# Patient Record
Sex: Female | Born: 1935 | Race: White | Hispanic: No | Marital: Married | Smoking: Never smoker
Health system: Southern US, Community
[De-identification: ages and names within clinical notes are randomized; demographics above are authoritative.]

## PROBLEM LIST (undated history)

## (undated) DIAGNOSIS — I4891 Unspecified atrial fibrillation: Secondary | ICD-10-CM

## (undated) DIAGNOSIS — R238 Other skin changes: Secondary | ICD-10-CM

## (undated) DIAGNOSIS — I499 Cardiac arrhythmia, unspecified: Secondary | ICD-10-CM

## (undated) DIAGNOSIS — E079 Disorder of thyroid, unspecified: Secondary | ICD-10-CM

## (undated) DIAGNOSIS — C50919 Malignant neoplasm of unspecified site of unspecified female breast: Secondary | ICD-10-CM

## (undated) DIAGNOSIS — C801 Malignant (primary) neoplasm, unspecified: Secondary | ICD-10-CM

## (undated) DIAGNOSIS — I639 Cerebral infarction, unspecified: Secondary | ICD-10-CM

## (undated) DIAGNOSIS — E7800 Pure hypercholesterolemia, unspecified: Secondary | ICD-10-CM

## (undated) DIAGNOSIS — M8580 Other specified disorders of bone density and structure, unspecified site: Secondary | ICD-10-CM

## (undated) DIAGNOSIS — N63 Unspecified lump in unspecified breast: Secondary | ICD-10-CM

## (undated) DIAGNOSIS — I519 Heart disease, unspecified: Secondary | ICD-10-CM

## (undated) DIAGNOSIS — Z973 Presence of spectacles and contact lenses: Secondary | ICD-10-CM

## (undated) DIAGNOSIS — I10 Essential (primary) hypertension: Secondary | ICD-10-CM

## (undated) DIAGNOSIS — R32 Unspecified urinary incontinence: Secondary | ICD-10-CM

## (undated) DIAGNOSIS — Z923 Personal history of irradiation: Secondary | ICD-10-CM

## (undated) DIAGNOSIS — E785 Hyperlipidemia, unspecified: Secondary | ICD-10-CM

## (undated) DIAGNOSIS — E039 Hypothyroidism, unspecified: Secondary | ICD-10-CM

## (undated) DIAGNOSIS — R233 Spontaneous ecchymoses: Secondary | ICD-10-CM

## (undated) HISTORY — DX: Unspecified atrial fibrillation: I48.91

## (undated) HISTORY — DX: Other specified disorders of bone density and structure, unspecified site: M85.80

## (undated) HISTORY — DX: Presence of spectacles and contact lenses: Z97.3

## (undated) HISTORY — PX: ABDOMINAL HYSTERECTOMY: SHX81

## (undated) HISTORY — DX: Cerebral infarction, unspecified: I63.9

## (undated) HISTORY — DX: Essential (primary) hypertension: I10

## (undated) HISTORY — DX: Unspecified urinary incontinence: R32

## (undated) HISTORY — DX: Disorder of thyroid, unspecified: E07.9

## (undated) HISTORY — DX: Hyperlipidemia, unspecified: E78.5

## (undated) HISTORY — DX: Hypothyroidism, unspecified: E03.9

## (undated) HISTORY — DX: Malignant (primary) neoplasm, unspecified: C80.1

## (undated) HISTORY — PX: OVARIAN CYST SURGERY: SHX726

## (undated) HISTORY — DX: Other skin changes: R23.8

## (undated) HISTORY — DX: Unspecified lump in unspecified breast: N63.0

## (undated) HISTORY — DX: Malignant neoplasm of unspecified site of unspecified female breast: C50.919

## (undated) HISTORY — DX: Cardiac arrhythmia, unspecified: I49.9

## (undated) HISTORY — DX: Heart disease, unspecified: I51.9

## (undated) HISTORY — PX: TONSILLECTOMY AND ADENOIDECTOMY: SUR1326

## (undated) HISTORY — DX: Spontaneous ecchymoses: R23.3

## (undated) HISTORY — PX: APPENDECTOMY: SHX54

## (undated) HISTORY — DX: Pure hypercholesterolemia, unspecified: E78.00

## (undated) HISTORY — PX: CATARACT EXTRACTION, BILATERAL: SHX1313

---

## 2000-09-07 ENCOUNTER — Encounter: Payer: Self-pay | Admitting: Orthopedic Surgery

## 2000-09-10 ENCOUNTER — Inpatient Hospital Stay (HOSPITAL_COMMUNITY): Admission: RE | Admit: 2000-09-10 | Discharge: 2000-09-15 | Payer: Self-pay | Admitting: Orthopedic Surgery

## 2001-11-02 ENCOUNTER — Encounter: Payer: Self-pay | Admitting: Orthopedic Surgery

## 2001-11-02 ENCOUNTER — Encounter: Admission: RE | Admit: 2001-11-02 | Discharge: 2001-11-02 | Payer: Self-pay | Admitting: Orthopedic Surgery

## 2002-02-08 ENCOUNTER — Encounter: Payer: Self-pay | Admitting: Orthopedic Surgery

## 2002-02-09 ENCOUNTER — Inpatient Hospital Stay (HOSPITAL_COMMUNITY): Admission: RE | Admit: 2002-02-09 | Discharge: 2002-02-15 | Payer: Self-pay | Admitting: Orthopedic Surgery

## 2002-02-09 ENCOUNTER — Encounter: Payer: Self-pay | Admitting: Orthopedic Surgery

## 2002-03-21 ENCOUNTER — Inpatient Hospital Stay (HOSPITAL_COMMUNITY): Admission: EM | Admit: 2002-03-21 | Discharge: 2002-03-22 | Payer: Self-pay | Admitting: Emergency Medicine

## 2002-03-21 ENCOUNTER — Encounter: Payer: Self-pay | Admitting: Emergency Medicine

## 2002-03-22 ENCOUNTER — Encounter (INDEPENDENT_AMBULATORY_CARE_PROVIDER_SITE_OTHER): Payer: Self-pay | Admitting: Cardiology

## 2003-03-18 ENCOUNTER — Encounter: Payer: Self-pay | Admitting: Emergency Medicine

## 2003-03-18 ENCOUNTER — Inpatient Hospital Stay (HOSPITAL_COMMUNITY): Admission: EM | Admit: 2003-03-18 | Discharge: 2003-03-28 | Payer: Self-pay | Admitting: Emergency Medicine

## 2003-03-18 DIAGNOSIS — I639 Cerebral infarction, unspecified: Secondary | ICD-10-CM

## 2003-03-18 HISTORY — DX: Cerebral infarction, unspecified: I63.9

## 2003-03-19 ENCOUNTER — Encounter: Payer: Self-pay | Admitting: Pediatrics

## 2003-03-20 ENCOUNTER — Encounter (INDEPENDENT_AMBULATORY_CARE_PROVIDER_SITE_OTHER): Payer: Self-pay | Admitting: *Deleted

## 2003-03-21 ENCOUNTER — Encounter: Payer: Self-pay | Admitting: Pulmonary Disease

## 2003-03-21 ENCOUNTER — Encounter (INDEPENDENT_AMBULATORY_CARE_PROVIDER_SITE_OTHER): Payer: Self-pay | Admitting: Cardiology

## 2003-05-14 ENCOUNTER — Emergency Department (HOSPITAL_COMMUNITY): Admission: EM | Admit: 2003-05-14 | Discharge: 2003-05-14 | Payer: Self-pay | Admitting: Podiatry

## 2003-05-18 ENCOUNTER — Emergency Department (HOSPITAL_COMMUNITY): Admission: EM | Admit: 2003-05-18 | Discharge: 2003-05-18 | Payer: Self-pay | Admitting: Emergency Medicine

## 2003-10-04 ENCOUNTER — Ambulatory Visit (HOSPITAL_COMMUNITY): Admission: RE | Admit: 2003-10-04 | Discharge: 2003-10-04 | Payer: Self-pay | Admitting: Cardiology

## 2005-06-11 ENCOUNTER — Ambulatory Visit (HOSPITAL_COMMUNITY): Admission: RE | Admit: 2005-06-11 | Discharge: 2005-06-11 | Payer: Self-pay | Admitting: Gastroenterology

## 2005-06-11 ENCOUNTER — Encounter (INDEPENDENT_AMBULATORY_CARE_PROVIDER_SITE_OTHER): Payer: Self-pay | Admitting: Specialist

## 2008-06-30 HISTORY — PX: BREAST LUMPECTOMY: SHX2

## 2009-03-27 ENCOUNTER — Inpatient Hospital Stay (HOSPITAL_COMMUNITY): Admission: EM | Admit: 2009-03-27 | Discharge: 2009-03-28 | Payer: Self-pay | Admitting: Emergency Medicine

## 2010-06-19 DIAGNOSIS — C50919 Malignant neoplasm of unspecified site of unspecified female breast: Secondary | ICD-10-CM | POA: Insufficient documentation

## 2010-06-27 ENCOUNTER — Encounter
Admission: RE | Admit: 2010-06-27 | Discharge: 2010-06-27 | Payer: Self-pay | Source: Home / Self Care | Attending: Radiology | Admitting: Radiology

## 2010-07-04 ENCOUNTER — Ambulatory Visit: Payer: Self-pay | Admitting: Oncology

## 2010-07-10 LAB — CANCER ANTIGEN 27.29: CA 27.29: 35 U/mL (ref 0–39)

## 2010-07-10 LAB — CBC WITH DIFFERENTIAL/PLATELET
BASO%: 0.6 % (ref 0.0–2.0)
Basophils Absolute: 0 10*3/uL (ref 0.0–0.1)
EOS%: 1.7 % (ref 0.0–7.0)
Eosinophils Absolute: 0.1 10*3/uL (ref 0.0–0.5)
HCT: 44 % (ref 34.8–46.6)
HGB: 15 g/dL (ref 11.6–15.9)
LYMPH%: 29.7 % (ref 14.0–49.7)
MCH: 30.4 pg (ref 25.1–34.0)
MCHC: 34 g/dL (ref 31.5–36.0)
MCV: 89.3 fL (ref 79.5–101.0)
MONO#: 0.3 10*3/uL (ref 0.1–0.9)
MONO%: 7.8 % (ref 0.0–14.0)
NEUT#: 2.6 10*3/uL (ref 1.5–6.5)
NEUT%: 60.2 % (ref 38.4–76.8)
Platelets: 222 10*3/uL (ref 145–400)
RBC: 4.92 10*6/uL (ref 3.70–5.45)
RDW: 13.1 % (ref 11.2–14.5)
WBC: 4.3 10*3/uL (ref 3.9–10.3)
lymph#: 1.3 10*3/uL (ref 0.9–3.3)

## 2010-07-11 LAB — COMPREHENSIVE METABOLIC PANEL
ALT: 18 U/L (ref 0–35)
AST: 23 U/L (ref 0–37)
Albumin: 4.7 g/dL (ref 3.5–5.2)
Alkaline Phosphatase: 64 U/L (ref 39–117)
BUN: 12 mg/dL (ref 6–23)
CO2: 26 mEq/L (ref 19–32)
Calcium: 9.6 mg/dL (ref 8.4–10.5)
Chloride: 102 mEq/L (ref 96–112)
Creatinine, Ser: 0.91 mg/dL (ref 0.40–1.20)
Glucose, Bld: 101 mg/dL — ABNORMAL HIGH (ref 70–99)
Potassium: 4.1 mEq/L (ref 3.5–5.3)
Sodium: 141 mEq/L (ref 135–145)
Total Bilirubin: 0.9 mg/dL (ref 0.3–1.2)
Total Protein: 6.7 g/dL (ref 6.0–8.3)

## 2010-08-05 ENCOUNTER — Other Ambulatory Visit (HOSPITAL_COMMUNITY): Payer: Self-pay | Admitting: Surgery

## 2010-08-05 ENCOUNTER — Encounter (HOSPITAL_COMMUNITY)
Admission: RE | Admit: 2010-08-05 | Discharge: 2010-08-05 | Disposition: A | Discharge status: Home / Self Care | Payer: MEDICARE | Source: Ambulatory Visit | Attending: Surgery | Admitting: Surgery

## 2010-08-05 ENCOUNTER — Ambulatory Visit (HOSPITAL_COMMUNITY)
Admission: RE | Admit: 2010-08-05 | Discharge: 2010-08-05 | Disposition: A | Discharge status: Home / Self Care | Payer: MEDICARE | Source: Ambulatory Visit | Attending: Surgery | Admitting: Surgery

## 2010-08-05 DIAGNOSIS — I10 Essential (primary) hypertension: Secondary | ICD-10-CM | POA: Insufficient documentation

## 2010-08-05 DIAGNOSIS — Z01818 Encounter for other preprocedural examination: Secondary | ICD-10-CM | POA: Insufficient documentation

## 2010-08-05 DIAGNOSIS — C50919 Malignant neoplasm of unspecified site of unspecified female breast: Secondary | ICD-10-CM | POA: Insufficient documentation

## 2010-08-05 DIAGNOSIS — C50911 Malignant neoplasm of unspecified site of right female breast: Secondary | ICD-10-CM

## 2010-08-05 LAB — URINALYSIS, ROUTINE W REFLEX MICROSCOPIC
Nitrite: NEGATIVE
Protein, ur: NEGATIVE mg/dL
Specific Gravity, Urine: 1.016 (ref 1.005–1.030)
Urine Glucose, Fasting: NEGATIVE mg/dL
pH: 7 (ref 5.0–8.0)

## 2010-08-05 LAB — DIFFERENTIAL
Basophils Absolute: 0 10*3/uL (ref 0.0–0.1)
Basophils Relative: 1 % (ref 0–1)
Eosinophils Absolute: 0.1 10*3/uL (ref 0.0–0.7)
Eosinophils Relative: 2 % (ref 0–5)
Lymphs Abs: 1.6 10*3/uL (ref 0.7–4.0)
Neutro Abs: 2.4 10*3/uL (ref 1.7–7.7)
Neutrophils Relative %: 55 % (ref 43–77)

## 2010-08-05 LAB — CANCER ANTIGEN 27.29: CA 27.29: 28 U/mL (ref 0–39)

## 2010-08-05 LAB — COMPREHENSIVE METABOLIC PANEL
Albumin: 4.2 g/dL (ref 3.5–5.2)
CO2: 28 mEq/L (ref 19–32)
Creatinine, Ser: 0.91 mg/dL (ref 0.4–1.2)
GFR calc non Af Amer: 60 mL/min — ABNORMAL LOW (ref >60)
Potassium: 4 mEq/L (ref 3.5–5.1)
Total Bilirubin: 0.8 mg/dL (ref 0.3–1.2)
Total Protein: 7.2 g/dL (ref 6.0–8.3)

## 2010-08-05 LAB — PROTIME-INR
INR: 1.15 (ref 0.00–1.49)
Prothrombin Time: 14.9 seconds (ref 11.6–15.2)

## 2010-08-05 LAB — CBC
MCV: 87.4 fL (ref 78.0–100.0)
WBC: 4.4 10*3/uL (ref 4.0–10.5)

## 2010-08-06 ENCOUNTER — Observation Stay (HOSPITAL_COMMUNITY)
Admission: RE | Admit: 2010-08-06 | Discharge: 2010-08-07 | Disposition: A | Discharge status: Home / Self Care | Payer: MEDICARE | Source: Ambulatory Visit | Attending: Surgery | Admitting: Surgery

## 2010-08-06 ENCOUNTER — Other Ambulatory Visit: Payer: Self-pay | Admitting: Surgery

## 2010-08-06 DIAGNOSIS — Z01818 Encounter for other preprocedural examination: Secondary | ICD-10-CM | POA: Insufficient documentation

## 2010-08-06 DIAGNOSIS — C50919 Malignant neoplasm of unspecified site of unspecified female breast: Secondary | ICD-10-CM

## 2010-08-06 DIAGNOSIS — Z01812 Encounter for preprocedural laboratory examination: Secondary | ICD-10-CM | POA: Insufficient documentation

## 2010-08-06 DIAGNOSIS — C50419 Malignant neoplasm of upper-outer quadrant of unspecified female breast: Principal | ICD-10-CM | POA: Insufficient documentation

## 2010-08-06 HISTORY — PX: BREAST LUMPECTOMY: SHX2

## 2010-08-06 HISTORY — DX: Malignant neoplasm of unspecified site of unspecified female breast: C50.919

## 2010-08-08 NOTE — Op Note (Signed)
[name], [name]               ACCOUNT NO.:  [id number]  MEDICAL RECORD NO.:  [id number]           PATIENT TYPE:  I  LOCATION:  5157                         FACILITY:  [facility]  PHYSICIAN:  Currie Paris, M.D.DATE OF BIRTH:  [birth year]  DATE OF PROCEDURE:  08/06/2010 DATE OF DISCHARGE:                              OPERATIVE REPORT   PREOPERATIVE DIAGNOSIS:  Carcinoma, left breast upper outer quadrant.  POSTOPERATIVE DIAGNOSIS:  Carcinoma, left breast upper outer quadrant.  PROCEDURE:  Needle-guided right lumpectomy.  SURGEON:  Currie Paris, MD  ANESTHESIA:  General.  CLINICAL HISTORY:  This is a 77 year old lady recently found to have a right breast cancer upper outer quadrant which was receptor positive. At the time of her biopsy, she got a fairly significant hematoma, so surgical intervention was delayed somewhat to allow the hematoma to resolve.  The patient has been on Coumadin, so she was bridged with some Lovenox, plans to start her back on Coumadin this evening without the need to go back on Lovenox.  DESCRIPTION OF PROCEDURE:  I saw the patient in the holding area and she had no further questions.  I reviewed the localizing films and discussed them with radiologist.  The lesion was about 7 cm from the skin entry site.  The patient was taken to the operating room, and after satisfactory general anesthesia had been obtained the right breast was prepped and draped and a time-out was done.  The guidewire entered at about the 9 o'clock position, traveled superiorly and slightly medially.  About 3 cm of the tract, the guidewire was palpable just subcutaneous and seemed to enter the area of the tumor.  By palpation, there was a firm area where I thought the tumor resided and this appeared to be coincident with where I thought the guidewire was tracking.  I made a curvilinear incision directly over that, raised very thin skin flap and then took a wide  excision of tissue around the guidewire going wide in all directions.  Inferiorly, there appeared to be some changes which I thought was resolving hematoma.  To palpation, the tumor appeared to be very close to my anterior border or least that was the most palpably abnormal area, but because of the hematoma difficult to tell whether this was postbiopsy change or tumor.  Nevertheless, we did a specimen mammogram which appeared to show the tumor in the center of the lesion confirmed by the radiologist.  I did, however, go ahead and take some additional inferior margin where I thought by palpation I was close, and since I thought the anterior margin was close I took a centimeter of skin from both the superior and inferior margins so that I was taking the skin directly overlying the specimen where the tumor was palpably close to the skin.  I put 0.25% plain Marcaine in.  I put clips to mark the margins.  I irrigated and carefully checked for hemostasis, and once I was convinced everything was dry I went ahead and closed with 3-0 Vicryl, 4-0 Monocryl subcuticular plus some Dermabond.  The patient tolerated the procedure well, and there were  no complications.  All counts were correct.     Currie Paris, M.D.     CJS/MEDQ  D:  08/06/2010  T:  08/07/2010  Job:  [id number]  cc:   Georgann Housekeeper, MD Corky Crafts, MD  Electronically Signed by Cyndia Bent M.D. on 08/08/2010 02:38:33 PM

## 2010-08-12 ENCOUNTER — Ambulatory Visit: Payer: MEDICARE | Attending: Radiation Oncology | Admitting: Radiation Oncology

## 2010-08-12 DIAGNOSIS — Z7901 Long term (current) use of anticoagulants: Secondary | ICD-10-CM | POA: Insufficient documentation

## 2010-08-12 DIAGNOSIS — Z800 Family history of malignant neoplasm of digestive organs: Secondary | ICD-10-CM | POA: Insufficient documentation

## 2010-08-12 DIAGNOSIS — E7800 Pure hypercholesterolemia, unspecified: Secondary | ICD-10-CM | POA: Insufficient documentation

## 2010-08-12 DIAGNOSIS — Z8673 Personal history of transient ischemic attack (TIA), and cerebral infarction without residual deficits: Secondary | ICD-10-CM | POA: Insufficient documentation

## 2010-08-12 DIAGNOSIS — Z79899 Other long term (current) drug therapy: Secondary | ICD-10-CM | POA: Insufficient documentation

## 2010-08-12 DIAGNOSIS — Y842 Radiological procedure and radiotherapy as the cause of abnormal reaction of the patient, or of later complication, without mention of misadventure at the time of the procedure: Secondary | ICD-10-CM | POA: Insufficient documentation

## 2010-08-12 DIAGNOSIS — Z170 Estrogen receptor positive status [ER+]: Secondary | ICD-10-CM | POA: Insufficient documentation

## 2010-08-12 DIAGNOSIS — Z96659 Presence of unspecified artificial knee joint: Secondary | ICD-10-CM | POA: Insufficient documentation

## 2010-08-12 DIAGNOSIS — Z803 Family history of malignant neoplasm of breast: Secondary | ICD-10-CM | POA: Insufficient documentation

## 2010-08-12 DIAGNOSIS — Z9089 Acquired absence of other organs: Secondary | ICD-10-CM | POA: Insufficient documentation

## 2010-08-12 DIAGNOSIS — C50419 Malignant neoplasm of upper-outer quadrant of unspecified female breast: Secondary | ICD-10-CM | POA: Insufficient documentation

## 2010-08-12 DIAGNOSIS — I10 Essential (primary) hypertension: Secondary | ICD-10-CM | POA: Insufficient documentation

## 2010-08-12 DIAGNOSIS — E039 Hypothyroidism, unspecified: Secondary | ICD-10-CM | POA: Insufficient documentation

## 2010-08-12 DIAGNOSIS — L988 Other specified disorders of the skin and subcutaneous tissue: Secondary | ICD-10-CM | POA: Insufficient documentation

## 2010-08-12 DIAGNOSIS — I4891 Unspecified atrial fibrillation: Secondary | ICD-10-CM | POA: Insufficient documentation

## 2010-08-12 DIAGNOSIS — Z510 Encounter for antineoplastic radiation therapy: Secondary | ICD-10-CM | POA: Insufficient documentation

## 2010-08-12 HISTORY — PX: REPLACEMENT TOTAL KNEE BILATERAL: SUR1225

## 2010-08-16 ENCOUNTER — Ambulatory Visit: Payer: Self-pay | Admitting: Radiation Oncology

## 2010-08-20 NOTE — Discharge Summary (Signed)
  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  CLINICAL HISTORY:  Ms. Montrose is a 78 year old lady recently found to have a right breast cancer upper outer quadrant which was receptor positive.  She developed a significant hematoma from the biopsy apparently due to her Coumadin, so she was bridged with Lovenox and we had delayed for the hematoma to resolve.  A guidewire was placed.  A wide lumpectomy was done and the mass appeared to be very superficial and I was concerned about the anterior margin, so I took overlying skin as a separate specimen.  Also appeared to be somewhat close to the inferior margin, so I took an extra tissue there as well.  The rest of margins grossly appeared to be completely negative.  HOSPITAL COURSE:  The patient tolerated her procedure well and was able to be discharged following surgery.  She had no evidence of postoperative bleeding.  Pathology report showed invasive lobular carcinoma 1.9 cm which was focally 0.1 cm from the anterior margin, but this was prior to considering the fact the overlying skin was excised.  Also had DCIS focally involving the inferior margin and close to the anterior and medial margins.  The inferior margin was reexcised and had actually a second little focus of lobular invasive carcinoma, but now had a 6-mm margin.  The DCIS was 1 cm from the new inferior margin.  She still had a remaining close medial margin.  The patient was discharged to be followed in our office.  Pathology report was  pending at the time of her discharge, so this will be discussed with her following discharge.     Currie Paris, M.D.     CJS/MEDQ  D:  08/16/2010  T:  08/17/2010  Job:  [id number]  Electronically Signed by Cyndia Bent M.D. on 08/20/2010 08:35:18 AM

## 2010-09-02 ENCOUNTER — Encounter (HOSPITAL_BASED_OUTPATIENT_CLINIC_OR_DEPARTMENT_OTHER): Payer: MEDICARE | Admitting: Oncology

## 2010-09-02 DIAGNOSIS — Z170 Estrogen receptor positive status [ER+]: Secondary | ICD-10-CM

## 2010-09-02 DIAGNOSIS — C50919 Malignant neoplasm of unspecified site of unspecified female breast: Secondary | ICD-10-CM

## 2010-10-04 LAB — DIFFERENTIAL
Lymphs Abs: 1.8 10*3/uL (ref 0.7–4.0)
Monocytes Absolute: 0.4 10*3/uL (ref 0.1–1.0)
Monocytes Relative: 7 % (ref 3–12)

## 2010-10-04 LAB — CARDIAC PANEL(CRET KIN+CKTOT+MB+TROPI)
CK, MB: 2.2 ng/mL (ref 0.3–4.0)
Relative Index: 1.1 (ref 0.0–2.5)
Relative Index: 1.7 (ref 0.0–2.5)
Total CK: 144 U/L (ref 7–177)
Troponin I: 0.01 ng/mL (ref 0.00–0.06)

## 2010-10-04 LAB — PROTIME-INR
INR: 3.9 — ABNORMAL HIGH (ref 0.00–1.49)
INR: 4.1 — ABNORMAL HIGH (ref 0.00–1.49)
Prothrombin Time: 37.6 seconds — ABNORMAL HIGH (ref 11.6–15.2)
Prothrombin Time: 39.3 seconds — ABNORMAL HIGH (ref 11.6–15.2)

## 2010-10-04 LAB — POCT I-STAT, CHEM 8
Calcium, Ion: 1.06 mmol/L — ABNORMAL LOW (ref 1.12–1.32)
Chloride: 104 mEq/L (ref 96–112)
Hemoglobin: 15.3 g/dL — ABNORMAL HIGH (ref 12.0–15.0)
Sodium: 142 mEq/L (ref 135–145)

## 2010-10-04 LAB — POCT CARDIAC MARKERS
CKMB, poc: 1.7 ng/mL (ref 1.0–8.0)
Troponin i, poc: <0.05 ng/mL (ref 0.00–0.09)

## 2010-10-04 LAB — CBC
MCHC: 34.7 g/dL (ref 30.0–36.0)
Platelets: 181 10*3/uL (ref 150–400)
WBC: 4.9 10*3/uL (ref 4.0–10.5)

## 2010-11-01 ENCOUNTER — Ambulatory Visit: Payer: MEDICARE | Attending: Radiation Oncology | Admitting: Radiation Oncology

## 2010-11-15 NOTE — H&P (Signed)
[name], [name]                         ACCOUNT NO.:  [id number]   MEDICAL RECORD NO.:  [id number]                   PATIENT TYPE:  INP   LOCATION:  3108                                 FACILITY:  [facility]   PHYSICIAN:  Casimiro Needle L. Thad Ranger, M.D.           DATE OF BIRTH:  Feb 01, 1930   DATE OF ADMISSION:  03/18/2003  DATE OF DISCHARGE:                                HISTORY & PHYSICAL   CHIEF COMPLAINT:  Right-sided weakness.   HISTORY OF PRESENT ILLNESS:  This is the second [facility]  admission for this 77 year old woman with a past medical history which  includes hypertension noted on admission approximately one year ago for new  onset of atrial fibrillation.  The patient reports that she was sitting  eating dinner at about 8 p.m. this evening when she noted acute onset of  right-sided weakness.  She felt that she was leaning over to the right side  and that her arm and leg were weak.  EMS was alerted, and on their arrival,  they noted slurred speech, right facial droop, and weakness of both the  right upper and lower extremities.  However, she improved en route, and by  the time of my examination in the emergency room was noted to only have  right lower extremity weakness with normal speech and normal strength of  face and arm.  Subsequent examination did reveal some fluctuation with more  dense weakness of the right leg and intermittent weakness of the right arm.  There was no recent history of previous similar symptoms.  The patient  denies any associated headache, chest pain, shortness of breath, nausea,  vomiting, palpitations.   PAST MEDICAL HISTORY:  Remarkable for arrhythmia.  In looking at her old  chart, it appears that she was admitted for atrial fibrillation about a year  ago.  She has not been subsequently anticoagulated and presently seems to  spend most of her time in a bigeminy rhythm.  She also has hypertension and  unknown control as well as  hypothyroidism.   FAMILY HISTORY:  Brother died of colon cancer.  Mother died of breast  cancer.   SOCIAL HISTORY:  She lives with her husband and normally independent in  activities of daily living.  She does not smoke.   ALLERGIES:  No known allergies.   MEDICATIONS:  1. Pravachol 40 mg daily.  2. Atenolol 50 mg daily.  3. Levothyroxine 0.1 mg daily.  4. Baby aspirin.   REVIEW OF SYSTEMS:  Full Review of Systems was undertaken and is negative  except as outlined in the written HPI and admission nursing records.  Pertinent positives include some blurry vision, slurred speech at the onset  of stroke symptoms which is better.  History of bilateral total knee  replacements.   PHYSICAL EXAMINATION:  VITAL SIGNS:  Temperature 97.6, blood pressure  191/72, pulse 84, respirations 18.  GENERAL/MENTAL STATUS:  She  appears alert and in no acute distress.  Her  speech is just a little bit hesitant but is normal in content. She is able  to name and repeat without difficulty.  Her mood is somewhat labile, and she  suddenly sometimes will start crying.  HEENT:  Head: Canium normocephalic and atraumatic.  Oropharynx is benign.  NECK:  Supple without carotid bruits.  HEART:  Regular rate with an underlying bigeminy rhythm.  No murmurs.  CHEST:  Clear to auscultation.  ABDOMEN:  Soft, nontender.  Normoactive bowel sounds.  EXTREMITIES:  2+ pulses, no edema.  NEUROLOGIC:  Mental status as above.  Cranial nerves: Pupils equal and  reactive.  Extraocular movements full without nystagmus.  Visual fields are  full to confrontation.  Facial sensation intact to light touch.  Tongue and  palate move normally and symmetrically.  Motor  normal on the left, normal  in the right upper extremity.  In the right lower extremity, there is a 3/5  proximal, 2/5 distal strength. Sensation: Diminished pinprick sensation over  the right lower extremity, otherwise intact.  Double simultaneous  stimulation intact.   Reflexes 1+.  Toes are downgoing.  Finger-to-nose is  performed adequately.   LABORATORY DATA:  CBC is unremarkable.  BMET is remarkable for an elevated  glucose of 145.  Coags are unremarkable.   CT of the head is personally reviewed and demonstrates scattered subcortical  high point density which probably represent old lacunar infarct.  There is  no definite acute finding.   IMPRESSION:  Acute left brain stroke, possibly left anterior cerebral artery  territory with fluctuating right hemiparesis involving the leg greater than  the arm.   PLAN:  Intravenous tPA was administered in the emergency room with the bolus  being given at 2153 and the remainder over one hour.  Subsequently the  patient was transferred to the neurologic ICU where she will be observed for  routine post ICU stroke care and workup to include MRI/MRA, carotid and  transcranial Dopplers, echocardiogram, etc.  Physical and occupational  therapy and possibly rehabilitation will also be consulted.                                                Michael L. Thad Ranger, M.D.    MLR/MEDQ  D:  03/19/2003  T:  03/19/2003  Job:  [id number]

## 2010-11-15 NOTE — Op Note (Signed)
NAMEBRANDA, CHAUDHARY               ACCOUNT NO.:  [id number]   MEDICAL RECORD NO.:  [id number]          PATIENT TYPE:  AMB   LOCATION:  ENDO                         [facility]AC[facility]   PHYSICIAN:  Danise Edge, M.D.   DATE OF BIRTH:  [birth year]   DATE OF PROCEDURE:  06/11/2005  DATE OF DISCHARGE:                                 OPERATIVE REPORT   PROCEDURE:  Screening colonoscopy.   INDICATIONS FOR PROCEDURE:  Ms. Shareena Nusz is a 77 year old female born  Feb 04, 1930.  Ms. Reta is scheduled to undergo her first screening  colonoscopy with polypectomy to prevent colon cancer.  She takes Coumadin  daily to prevent recurrent stroke due to chronic atrial fibrillation.  She  has been off Coumadin for five days prior to her colonoscopy.   ENDOSCOPIST:  Danise Edge, M.D.   PREMEDICATION:  Versed 5 mg, Demerol 40 mg.   PROCEDURE:  After obtaining informed consent, Ms. Swiney was placed in the  left lateral decubitus position.  I administered intravenous Demerol and  intravenous Versed to achieve conscious sedation for the procedure.  The  patient's blood pressure, oxygen saturation, and cardiac rhythm were  monitored throughout the procedure and documented in the medical record.   Anal inspection and digital rectal exam was normal.  The Olympus adjustable  pediatric colonoscope was introduced into the rectum and with a moderate  amount of difficulty due to colonic loop formation, eventually advanced to  the cecum, as identified by a normal-appearing ileocecal valve and  appendiceal orifice.  Colonic preparation for the exam today was  satisfactory.   RECTUM:  Normal.  Retroflexed view of the distal rectum normal.   SIGMOID COLON/DESCENDING COLON:  At 40 cm from the anal verge, a 2 mm  sessile polyp was removed with the electrocautery snare.  A specimen could  not be retrieved.  The polypectomy site was cold biopsied.   SPLENIC FLEXURE:  Normal.   TRANSVERSE COLON:   Normal.   HEPATIC FLEXURE:  Normal.   ASCENDING COLON:  Normal.   CECUM AND ILEOCECAL VALVE:  Normal.   ASSESSMENT:  A 2 mm polyp removed from the sigmoid colon at 40 cm from the  anal verge.  The specimen was not retrieved with pathological evaluation.  The polypectomy site was cold biopsied.   RECOMMENDATIONS:  Repeat colonoscopy in five years.           ______________________________  Danise Edge, M.D.     MJ/MEDQ  D:  06/11/2005  T:  06/11/2005  Job:  [id number]   cc:   Georgann Housekeeper, MD  Fax: [phone]

## 2010-11-15 NOTE — Discharge Summary (Signed)
[name], [name]                         ACCOUNT NO.:  [id number]   MEDICAL RECORD NO.:  [id number]                   PATIENT TYPE:  INP   LOCATION:  3002                                 FACILITY:  [facility]   PHYSICIAN:  Pramod P. Pearlean Brownie, MD                 DATE OF BIRTH:  11-17-1929   DATE OF ADMISSION:  03/18/2003  DATE OF DISCHARGE:  03/28/2003                                 DISCHARGE SUMMARY   ADMISSION DIAGNOSIS:  Stroke.   DISCHARGE DIAGNOSES:  Left middle cerebral artery branch infarct of  cardioembolic etiology with paroxysmal atrial fibrillation.   HISTORY OF PRESENT ILLNESS:  [name] is a pleasant 77 year old lady who  was admitted for evaluation of sudden onset of episode of episode of  dysphasia with right leg weakness. The symptoms persisted at the time of  evaluation in the emergency room. She presented within three hours and was  considered a candidate for IV thrombolysis. She was given TPA as per  standard protocol, and she showed significant improvement, so the infusion  was completed. Repeat CT scan of the head did not show any evidence of  hemorrhage. Neurological exam improved substantially and back to her  baseline with a NIHSS score of 0. She was admitted for stroke risk  stratification workup and during telemetry monitoring during hospitalization  developed paroxysmal atrial fibrillation. She was started on IV heparin, and  cardiology was consulted to help with management with of her cardiac rhythm.  She was initially found to be quite bradycardic. MRI scan of the brain  subsequently showed a small left posterior parietal cortical infarction with  some white matter microangiopathic changes. MRI of the neck revealed no  significant carotid stenosis. There was moderate right subclavian and severe  right cortical arterial region stenosis seen. MRI of the brain showed  decreased peripheral branches of the left middle cerebral artery. Carotid  ultrasound  showed no significant stenosis; however, there was antegrade flow  noted on the carotid ultrasound and _________ artery. Cardiac echocardiogram  was normal with good ejection fraction. Transesophageal echocardiogram was  performed which revealed no evidence of intra-atrial clot and patent foramen  ovale. The patient was started on Coumadin for secondary stroke prevention  and kept in the hospital until her INR became optimal. She was started on  Rhythmol for rate control by the cardiologist. A UA showed evidence of  urinary tract infection for which she was started on ciprofloxacin. On the  day of discharge, she had no neurological deficits and was stable. INR was  1.9. She was asked to follow up with her cardiologist for Coumadin followup  in the future.   DISCHARGE MEDICATIONS:  1. Coumadin, dose to be adjusted to keep INR 2 to 3.  2. Rhythmol 225 mg three times a day.   FOLLOW UP:  She would follow up with her cardiologist in the future as  necessary and  Dr. Pearlean Brownie in two months in the office.                                                Pramod P. Pearlean Brownie, MD    PPS/MEDQ  D:  07/13/2003  T:  07/14/2003  Job:  [id number]   cc:   Francisca December, M.D.  [address]  [address]  [place]  [place] [address]  Fax: [phone]

## 2010-11-15 NOTE — Consult Note (Signed)
[name], [name]                         ACCOUNT NO.:  [id number]   MEDICAL RECORD NO.:  [id number]                   PATIENT TYPE:  INP   LOCATION:  3739                                 FACILITY:  [facility]   PHYSICIAN:  Francisca December, M.D.               DATE OF BIRTH:  [birth year]   DATE OF CONSULTATION:  03/21/2002  DATE OF DISCHARGE:  03/22/2002                                   CONSULTATION   CARDIOLOGY CONSULTATION:   REASON FOR CONSULTATION:  New onset atrial fibrillation.   HISTORY OF PRESENT ILLNESS:  The patient is a 77 year old female without  prior cardiac history who this morning called EMS because she was feeling  faint, light-headed, dizzy, sweaty, nauseous and had left substernal chest  discomfort.  Upon their arrival about 20 minutes later they found her to be  sitting in a chair upright with a heart rate of 198 beats per minute.  She  was instructed in the Valsalva maneuver and subsequently had prompt  reduction of her heart rate down to around 100 beats per minute.  That  rhythm strip is accompanying her chart and clearly shows an SVT that  converts to sinus rhythm.  She had resolution of her chest discomfort,  nausea, and diaphoresis.  She was subsequently transported to [facility] Emergency  Room and arrived in sinus rhythm as documented on EKG at 11:11 a.m.  Subsequently, she had conversion to atrial fibrillation as documented on an  EKG time of 11:41 a.m.  No details of any associated symptoms accompany that  rhythm change.   The patient denies prior cardiac history and is without previous myocardial  infarction or stroke.  She has never been told she had heart failure or  atrial fibrillation.  She denies any recent tachy palpitation or light-  headedness, has not had syncope or near syncope.  No recent history of  angina pectoris, orthopnea, PND, or lower extremity edema.   PAST MEDICAL HISTORY:  1. History of hypothyroidism on supplementation.  2.  Hyperlipidemia.  3. Hypertension.  4. Osteoarthritis bilateral knees.  5. Acne rosacea.  6. Urinary incontinence.  7. Mild glucose intolerance.   PAST SURGICAL HISTORY:  1. S/P bilateral TKR.  2. TAH/BSO and appendectomy remotely.   SOCIAL HISTORY AND HABITS:  She does not use any alcohol or tobacco.  She  has been married for 52 years and has two sons.  She is estranged from one  son which causes much stress.  She is a Futures trader.   MEDICATIONS ON ADMISSION:  1. Tenormin 25 mg p.o. q.d.  2. Levoxyl 0.1 mg p.o. q.d.  3. Oxycodone 5-10 mg p.o. q.h.s. p.r.n.  4. Tetracycline 500 mg p.o. q.i.d.   DRUG ALLERGIES:  None known.   FAMILY HISTORY:  Mother died of breast carcinoma age 68.  Father died of  myocardial infarction age 30. One brother has died of colon carcinoma.  REVIEW OF SYSTEMS:  She has not had any significant weight loss or gain.  No  fever, chills, or malaise.  She has no significant HEENT problem.  She does  wear reading glasses.  No difficulty with swallowing or swollen glands.  She  does not wear dentures.  The pulmonary system is clear without history of  asthma or COPD.  No dyspnea at rest or with exertion.  No orthopnea.  She  has never had a history of seizure or frequent headache.  No stroke.  She  was dizzy this morning as noted above but this is not a chronic problem for  her.  No extremity numbness or weakness.  She has no hematologic problem  such as anemia, excessive bleeding or easy bruising.  She has no hiatal  hernia or reflux-type symptoms, never had an ulcer, no hematochezia or  melena.  She denies any dysuria or hematuria.  She does have frequent stress  incontinence.  She has bilateral hip and knee pain.  She is status post  bilateral TKR as above.  She has no previous problem with chronic pain other  than her knees.  She has no difficulty sleeping and she feels like her  nutritional status is adequate.   PHYSICAL EXAMINATION:  VITAL SIGNS:  Blood pressure is 130/80, pulse is 88  and irregularly irregular, respiratory rate is 16, temperature 97.2, O2  saturation on room air 99%.  GENERAL: The patient is a well-nourished, well-developed, 77 year old woman  who is somewhat tearful when discussing her problems with her son.  HEENT: Unremarkable.  The pupils are equal, round, reactive to light and  accommodation.  Extraocular movements are intact.  Oral mucosa is pink and  moist.  Sclerae are anicteric.  The tongue is not coated.  Head is  atraumatic and normocephalic.  NECK: Supple without thyromegaly or masses.  The carotid upstrokes are  normal.  There is no bruit.  There is no jugular venous distention.  CHEST: Clear with adequate excursion.  Normal vesicular breath sounds are  heard throughout.  The precordium is quiet.  Normal S1 and S2, rhythm is  irregular.  There is an ejection systolic murmur along the left sternal  border.  There is no click, rub, or gallop.  ABDOMEN: Obese, soft, nontender.  No hepatosplenomegaly or midline pulsatile  mass, no abdominal bruit.  EXTERNAL GENITALIA: Atrophic.  RECTAL: Not performed.  EXTREMITIES: Full range of motion.  No edema, and intact distal pulses.  NEUROLOGICAL: Cranial nerves II-XII are intact.  Motor and sensory are  grossly intact.  Gait not tested.  SKIN: Warm, dry, and clear.   ACCESSORY CLINICAL DATA:  Electrocardiogram as noted above no ischemic  changes.  Serum electrolytes, BUN, creatinine and glucose are all within  normal limits with the exception of potassium of 3.3.  Admission hemogram is  normal.  PT is 13.0, INR 1.0.  Initial CK 31 with an MB of 0.8, troponin  0.02.  TSH is 0.353 and T4 is slightly elevated at 11.3.   IMPRESSIONS:  1. History of supraventricular tachycardia demonstrated on rhythm strip this     morning converted with a vagal maneuver to sinus rhythm. 2. Currently in atrial fibrillation with controlled ventricular response on     intravenous  Cardizem.  3. Anginal chest discomfort early this morning associated with an extremely     rapid heart rate.  4. No evidence by physical examination or ECG of significant structural     heart disease.  PLAN:  1. Agree with your management thus far which includes subcutaneous Lovenox     and IV Cardizem as well as continuing her outpatient beta blocker.  2. Agree with continuing serial enzymes and repeat ECG.  3. Will obtain 2-D echocardiogram.  4. If enzymes and ECG remain without evidence of coronary ischemia then     further evaluation will include and Adenosine or exercise Cardiolite.  If     either of the above positive then cardiac catheterization is in order.  5. She will probably convert to sinus rhythm on beta blocker and Cardizem     alone.  If not then will consider electrocardioversion.                                               Francisca December, M.D.    JHE/MEDQ  D:  03/21/2002  T:  03/23/2002  Job:  [id number]   cc:   Georgann Housekeeper, M.D.  [address]  [place]  [place] [address]  Fax: [phone]   Chart Room [phone]

## 2010-12-03 ENCOUNTER — Encounter (HOSPITAL_BASED_OUTPATIENT_CLINIC_OR_DEPARTMENT_OTHER): Payer: Medicare Other | Admitting: Oncology

## 2010-12-03 ENCOUNTER — Other Ambulatory Visit: Payer: Self-pay | Admitting: Oncology

## 2010-12-03 DIAGNOSIS — C50919 Malignant neoplasm of unspecified site of unspecified female breast: Secondary | ICD-10-CM

## 2010-12-03 DIAGNOSIS — C50419 Malignant neoplasm of upper-outer quadrant of unspecified female breast: Secondary | ICD-10-CM

## 2010-12-03 DIAGNOSIS — Z170 Estrogen receptor positive status [ER+]: Secondary | ICD-10-CM

## 2010-12-03 LAB — CBC WITH DIFFERENTIAL/PLATELET
BASO%: 0.2 % (ref 0.0–2.0)
Eosinophils Absolute: 0.1 10*3/uL (ref 0.0–0.5)
MONO#: 0.4 10*3/uL (ref 0.1–0.9)
NEUT#: 3.4 10*3/uL (ref 1.5–6.5)
RBC: 4.93 10*6/uL (ref 3.70–5.45)
RDW: 13.8 % (ref 11.2–14.5)
WBC: 5 10*3/uL (ref 3.9–10.3)
lymph#: 1.1 10*3/uL (ref 0.9–3.3)

## 2011-02-19 ENCOUNTER — Encounter (INDEPENDENT_AMBULATORY_CARE_PROVIDER_SITE_OTHER): Payer: Self-pay | Admitting: Surgery

## 2011-03-14 ENCOUNTER — Encounter (INDEPENDENT_AMBULATORY_CARE_PROVIDER_SITE_OTHER): Payer: Self-pay | Admitting: General Surgery

## 2011-03-14 DIAGNOSIS — Z853 Personal history of malignant neoplasm of breast: Secondary | ICD-10-CM | POA: Insufficient documentation

## 2011-03-18 ENCOUNTER — Ambulatory Visit (INDEPENDENT_AMBULATORY_CARE_PROVIDER_SITE_OTHER): Payer: Medicare Other | Admitting: Surgery

## 2011-03-18 ENCOUNTER — Encounter (INDEPENDENT_AMBULATORY_CARE_PROVIDER_SITE_OTHER): Payer: Self-pay | Admitting: Surgery

## 2011-03-18 VITALS — BP 132/82 | HR 64 | Temp 96.6°F | Resp 20 | Ht 63.0 in | Wt 173.4 lb

## 2011-03-18 DIAGNOSIS — Z853 Personal history of malignant neoplasm of breast: Secondary | ICD-10-CM

## 2011-03-18 NOTE — Progress Notes (Signed)
NAME: Kemya C Rann       DOB: Jan 18, 1930           DATE: 03/18/2011       MRN: [id number]   BRYA SIMERLY is a 77 y.o.Marland Kitchenfemale who presents for routine followup of her Right breast cancer diagnosed in Dec, 2011 and treated with lumpectomy, radiation and anti estrogen. She has no problems or concerns on either side, other than some sharp shooting pains in the Right breast PFSH: She has had no significant changes since the last visit here.  ROS: There have been no significant changes since the last visit here  EXAM: General: The patient is alert, oriented, generally healty appearing, NAD. Mood and affect are normal.  Breasts:  The right breast shows a palpable loss of tissue in the lumpectomy site in the upper outer quadrant. It is tender. There is no dominant mass. There are mild skin changes from radiation including a little edema of the nipple areolar complex the left breast is completely normal.  Lymphatics: She has no axillary or supraclavicular adenopathy on either side.  Extremities: Full ROM of the surgical side with no lymphedema noted.  Data Reviewed: No new data  Impression: Doing well, with no evidence of recurrent cancer or new cancer  Plan: Will continue to follow up on an annual basis here.

## 2011-03-18 NOTE — Patient Instructions (Signed)
I will plan to see back in one year for breast cancer followup. Asks radiologists to send me a copy of your mammogram when you have it done in December.  If you have any problems or concerns about her breast and back to see me sooner than your followup.

## 2011-06-09 ENCOUNTER — Other Ambulatory Visit (HOSPITAL_BASED_OUTPATIENT_CLINIC_OR_DEPARTMENT_OTHER): Payer: Medicare Other | Admitting: Lab

## 2011-06-09 ENCOUNTER — Other Ambulatory Visit: Payer: Self-pay | Admitting: Oncology

## 2011-06-09 DIAGNOSIS — C50419 Malignant neoplasm of upper-outer quadrant of unspecified female breast: Secondary | ICD-10-CM

## 2011-06-09 DIAGNOSIS — Z170 Estrogen receptor positive status [ER+]: Secondary | ICD-10-CM

## 2011-06-09 LAB — COMPREHENSIVE METABOLIC PANEL
ALT: 17 U/L (ref 0–35)
CO2: 27 mEq/L (ref 19–32)
Calcium: 9.1 mg/dL (ref 8.4–10.5)
Chloride: 105 mEq/L (ref 96–112)
Glucose, Bld: 122 mg/dL — ABNORMAL HIGH (ref 70–99)
Sodium: 142 mEq/L (ref 135–145)
Total Bilirubin: 0.6 mg/dL (ref 0.3–1.2)
Total Protein: 6.2 g/dL (ref 6.0–8.3)

## 2011-06-09 LAB — CBC WITH DIFFERENTIAL/PLATELET
Eosinophils Absolute: 0.1 10*3/uL (ref 0.0–0.5)
HCT: 41.1 % (ref 34.8–46.6)
LYMPH%: 24.8 % (ref 14.0–49.7)
MONO#: 0.3 10*3/uL (ref 0.1–0.9)
NEUT#: 2.5 10*3/uL (ref 1.5–6.5)
NEUT%: 65.5 % (ref 38.4–76.8)
Platelets: 169 10*3/uL (ref 145–400)
WBC: 3.8 10*3/uL — ABNORMAL LOW (ref 3.9–10.3)
lymph#: 0.9 10*3/uL (ref 0.9–3.3)

## 2011-06-09 LAB — CANCER ANTIGEN 27.29: CA 27.29: 30 U/mL (ref 0–39)

## 2011-06-19 ENCOUNTER — Ambulatory Visit (HOSPITAL_BASED_OUTPATIENT_CLINIC_OR_DEPARTMENT_OTHER): Payer: Medicare Other | Admitting: Oncology

## 2011-06-19 VITALS — BP 142/91 | HR 83 | Temp 97.5°F | Ht 60.5 in | Wt 169.3 lb

## 2011-06-19 DIAGNOSIS — Z853 Personal history of malignant neoplasm of breast: Secondary | ICD-10-CM

## 2011-06-19 DIAGNOSIS — M549 Dorsalgia, unspecified: Secondary | ICD-10-CM

## 2011-06-19 NOTE — Progress Notes (Signed)
ID: [name]  Interval History:   The patient returns today for routine followup of her breast cancer. She says she is doing "good" she is having some aches and pains here and there and she wonders if the letrozole is responsible for this. She is looking forward to the holidays when her son from [place] I will be visiting.  ROS:  The chief problem with pain is in her mid upper back. This has been present for some time. It seems to be aggravated by activity. She takes Tylenol perhaps once a day to relieve it. It radiates around the chest is slightly bilaterally. He describes herself is moderately fatigued, but otherwise "stable". She has psoriasis, thyroid problems, and baseline urinary incontinence. A detailed review of systems was otherwise unremarkable.   Medications: I have reviewed the patient's current medications.  Current Outpatient Prescriptions  Medication Sig Dispense Refill  . amLODipine (NORVASC) 5 MG tablet Take 5 mg by mouth daily.        . calcium-vitamin D (OSCAL WITH D) 500-200 MG-UNIT per tablet Take 1 tablet by mouth daily.        Marland Kitchen doxycycline (DORYX) 100 MG DR capsule Take 100 mg by mouth 3 (three) times a week.        . hydrochlorothiazide (HYDRODIURIL) 25 MG tablet Take 25 mg by mouth daily.        Marland Kitchen letrozole (FEMARA) 2.5 MG tablet daily.      Marland Kitchen levothyroxine (SYNTHROID, LEVOTHROID) 50 MCG tablet Take 50 mcg by mouth daily.        . Multiple Vitamin (MULTIVITAMIN) capsule Take 1 capsule by mouth daily.        . pravastatin (PRAVACHOL) 10 MG tablet Take 10 mg by mouth daily.        Marland Kitchen warfarin (COUMADIN) 5 MG tablet Take 5 mg by mouth daily. Plus 1/2 M-W-F        PAST MEDICAL HISTORY:  Significant for atrial fibrillation which has been present for about 7 years.  The patient is on chronic coumadinization for this.  She tells me she had a stroke 7 years ago when the atrial fibrillation first developed.  She has no motor residuals for this but she does have not as  good a sense of balance as before.  She has hypercholesterolemia, hypothyroidism, hypertension.  She is status post bilateral cataract surgery, status post bilateral knee replacement, history of tonsillectomy and adenoidectomy.  History of appendectomy.  History of hysterectomy with unilateral salpingo-oophorectomy and history of osteopenia.    FAMILY HISTORY:  The patient's father died from a myocardial infarction at the age of 69.  The patient's mother died from breast cancer at the age of 5.  The patient has one sister alive at age 13 and a brother who died from esophageal cancer at age 2.  There are no other family members with breast or ovarian cancer to her knowledge.    GYN HISTORY:  She is GX P2.  Menarche age 27.  First pregnancy to term at age 38.  Hysterectomy in 1962.  She never took hormone replacement therapy.    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 Her husband of 62 years, Onalee Hua, worked as an Art gallery manager. Son Onalee Hua lives in [place], works in Animal nutritionist. Son Brett Canales lives in [place] where he works for the [employer]. The patient has 4 grandchildren. She does not belong to a  church or synagogue.  Objective:  Filed Vitals:   06/19/11 1323  BP: 142/91  Pulse: 83  Temp: 97.5 F (36.4 C)   Body mass index is 32.52 kg/(m^2).  Physical Exam:   Sclerae unicteric  Oropharynx clear  No peripheral adenopathy  Lungs clear -- no rales or rhonchi  Heart regular rate and rhythm  Abdomen benign  MSK no focal spinal tenderness to for palpation of the entire thoracic spine.  Neuro nonfocal, physically specifically no arm weakness or sensory changes noted.  Breast exam: Right breast status post lumpectomy. There is no evidence of local recurrence. Left breast no suspicious masses.  Lab Results:  CMP    Chemistry      Component Value Date/Time   NA 142 06/09/2011 1325   NA 142  06/09/2011 1325   K 4.1 06/09/2011 1325   K 4.1 06/09/2011 1325   CL 105 06/09/2011 1325   CL 105 06/09/2011 1325   CO2 27 06/09/2011 1325   CO2 27 06/09/2011 1325   BUN 14 06/09/2011 1325   BUN 14 06/09/2011 1325   CREATININE 0.74 06/09/2011 1325   CREATININE 0.74 06/09/2011 1325      Component Value Date/Time   CALCIUM 9.1 06/09/2011 1325   CALCIUM 9.1 06/09/2011 1325   ALKPHOS 68 06/09/2011 1325   ALKPHOS 68 06/09/2011 1325   AST 25 06/09/2011 1325   AST 25 06/09/2011 1325   ALT 17 06/09/2011 1325   ALT 17 06/09/2011 1325   BILITOT 0.6 06/09/2011 1325   BILITOT 0.6 06/09/2011 1325       CBC Lab Results  Component Value Date   WBC 3.8* 06/09/2011   HGB 14.2 06/09/2011   HCT 41.1 06/09/2011   MCV 89.4 06/09/2011   PLT 169 06/09/2011   NEUTROABS 2.5 06/09/2011    Studies/Results:  Mammography within the last week at [facility], reportedly benign.  Assessment: 77 year old [place] woman status post right lumpectomy February 2012, for a T1c NX (Stage I) invasive lobular breast cancer which was strongly estrogen and progesterone receptor positive, HER-2/neu negative, with an MIB-1 of 9%.    PLAN: She wondered how much benefit she was getting from the letrozole so we went over her prognostic panel again. The Adjuvant! program would quote her a risk of recurrence in the 28% range.  This is assuming that she is node negative, of course, which she is clinically.  Anti-estrogens can lower that risk by 7%, bringing her down to a residual risk of 21%.   I am concerned about the persistent pain in her back--it was present at the last visit here, when she refused plain film evaluation. She understands this is most likely doe to osteoporosis or osteoarthritis, but that cancer can cause similar symptoms. At this point what she wants to do is wait and see if it gets better. She said she will call us mid January if things have not improved. I did describe the kyphoplasty procedure which  might significantly relieve her discomfort if we are dealing with a compression fracture. Again she decided she just wanted to wait. She will be seeing Dr. Eula Listen mid January and perhaps she can continue to discuss this issue with him at that time. Otherwise she will return for a routine visit here in 6 months.   [name] C 06/19/2011

## 2011-06-20 ENCOUNTER — Telehealth: Payer: Self-pay | Admitting: *Deleted

## 2011-06-20 NOTE — Telephone Encounter (Signed)
gave patient appointment for 11-2011 

## 2011-07-07 ENCOUNTER — Other Ambulatory Visit: Payer: Self-pay | Admitting: *Deleted

## 2011-07-07 DIAGNOSIS — C50919 Malignant neoplasm of unspecified site of unspecified female breast: Secondary | ICD-10-CM

## 2011-07-09 ENCOUNTER — Other Ambulatory Visit: Payer: Self-pay | Admitting: *Deleted

## 2011-07-09 ENCOUNTER — Encounter (INDEPENDENT_AMBULATORY_CARE_PROVIDER_SITE_OTHER): Payer: Self-pay | Admitting: Surgery

## 2011-07-10 ENCOUNTER — Telehealth: Payer: Self-pay | Admitting: *Deleted

## 2011-07-10 NOTE — Telephone Encounter (Signed)
called patient informed patient she can walkin to do the x-ray patient confirmed over the phone on 07-10-2011

## 2011-07-11 ENCOUNTER — Ambulatory Visit (HOSPITAL_COMMUNITY)
Admission: RE | Admit: 2011-07-11 | Discharge: 2011-07-11 | Disposition: A | Discharge status: Home / Self Care | Payer: Medicare Other | Source: Ambulatory Visit | Attending: Oncology | Admitting: Oncology

## 2011-07-11 DIAGNOSIS — C50919 Malignant neoplasm of unspecified site of unspecified female breast: Secondary | ICD-10-CM

## 2011-07-11 DIAGNOSIS — I700 Atherosclerosis of aorta: Secondary | ICD-10-CM | POA: Insufficient documentation

## 2011-07-11 DIAGNOSIS — M546 Pain in thoracic spine: Secondary | ICD-10-CM | POA: Insufficient documentation

## 2011-07-16 ENCOUNTER — Other Ambulatory Visit: Payer: Self-pay | Admitting: *Deleted

## 2011-07-16 DIAGNOSIS — M8580 Other specified disorders of bone density and structure, unspecified site: Secondary | ICD-10-CM

## 2011-07-16 MED ORDER — ALENDRONATE SODIUM 70 MG PO TABS: 70.0000 mg | ORAL_TABLET | ORAL | Status: DC

## 2011-07-16 NOTE — Telephone Encounter (Signed)
Per MD review of recent thoracic films with noted bone loss recommendation is for pt to initiate alendronate 70mg  weekly.  This RN called and discussed above with pt including how to take medication.  Per discussion pt informed this RN she will be seeing her primary MD this week. This result and recent labs faxed to Dr Donette Larry at [place].  Above records also mailed to pt per her request.

## 2011-08-04 ENCOUNTER — Other Ambulatory Visit: Payer: Self-pay | Admitting: Oncology

## 2011-08-04 DIAGNOSIS — C50919 Malignant neoplasm of unspecified site of unspecified female breast: Secondary | ICD-10-CM

## 2011-08-06 ENCOUNTER — Telehealth: Payer: Self-pay | Admitting: Oncology

## 2011-08-06 NOTE — Telephone Encounter (Signed)
S/w the pt regarding her mri appt. Pt did not think it was necessary at this point. Will give the messge to the desk nurse

## 2011-08-07 ENCOUNTER — Other Ambulatory Visit: Payer: Self-pay | Admitting: Oncology

## 2011-08-07 DIAGNOSIS — R52 Pain, unspecified: Secondary | ICD-10-CM

## 2011-08-08 ENCOUNTER — Other Ambulatory Visit: Payer: Medicare Other | Admitting: Lab

## 2011-08-08 ENCOUNTER — Inpatient Hospital Stay (HOSPITAL_COMMUNITY): Admission: RE | Admit: 2011-08-08 | Payer: Medicare Other | Source: Ambulatory Visit

## 2011-08-11 ENCOUNTER — Telehealth: Payer: Self-pay | Admitting: Oncology

## 2011-08-11 NOTE — Telephone Encounter (Signed)
S/w the pt and she refused to have any test done unless she s/w the md the pt s/w [name] and the mri appt has been cancelled the pt will not be seeing dr Robie Ridge for now

## 2011-08-14 ENCOUNTER — Ambulatory Visit: Payer: Medicare Other | Attending: Internal Medicine

## 2011-08-14 DIAGNOSIS — R262 Difficulty in walking, not elsewhere classified: Secondary | ICD-10-CM | POA: Insufficient documentation

## 2011-08-14 DIAGNOSIS — M545 Low back pain, unspecified: Secondary | ICD-10-CM | POA: Insufficient documentation

## 2011-08-14 DIAGNOSIS — M6281 Muscle weakness (generalized): Secondary | ICD-10-CM | POA: Insufficient documentation

## 2011-08-14 DIAGNOSIS — IMO0001 Reserved for inherently not codable concepts without codable children: Secondary | ICD-10-CM | POA: Insufficient documentation

## 2011-08-16 ENCOUNTER — Other Ambulatory Visit: Payer: Self-pay | Admitting: Oncology

## 2011-08-16 DIAGNOSIS — C50919 Malignant neoplasm of unspecified site of unspecified female breast: Secondary | ICD-10-CM

## 2011-08-18 ENCOUNTER — Other Ambulatory Visit: Payer: Self-pay | Admitting: Oncology

## 2011-08-18 DIAGNOSIS — C50919 Malignant neoplasm of unspecified site of unspecified female breast: Secondary | ICD-10-CM

## 2011-08-19 ENCOUNTER — Ambulatory Visit: Payer: Medicare Other

## 2011-08-21 ENCOUNTER — Encounter: Payer: Self-pay | Admitting: *Deleted

## 2011-08-21 ENCOUNTER — Ambulatory Visit: Payer: Medicare Other

## 2011-08-21 ENCOUNTER — Telehealth: Payer: Self-pay | Admitting: Oncology

## 2011-08-21 DIAGNOSIS — I4891 Unspecified atrial fibrillation: Secondary | ICD-10-CM | POA: Insufficient documentation

## 2011-08-21 NOTE — Telephone Encounter (Signed)
S/w the pt and she is aware of her appt with rad onc on 08/22/2011

## 2011-08-21 NOTE — Progress Notes (Signed)
Retired -school system, married, 2 sons  07/11/11  T-L spine xray: no evidence for lytic or blastic lesion, L3 fx

## 2011-08-22 ENCOUNTER — Ambulatory Visit
Admission: RE | Admit: 2011-08-22 | Discharge: 2011-08-22 | Disposition: A | Discharge status: Home / Self Care | Payer: Medicare Other | Source: Ambulatory Visit | Attending: Radiation Oncology | Admitting: Radiation Oncology

## 2011-08-22 ENCOUNTER — Encounter: Payer: Self-pay | Admitting: Radiation Oncology

## 2011-08-22 DIAGNOSIS — C50919 Malignant neoplasm of unspecified site of unspecified female breast: Secondary | ICD-10-CM | POA: Insufficient documentation

## 2011-08-22 DIAGNOSIS — Z853 Personal history of malignant neoplasm of breast: Secondary | ICD-10-CM

## 2011-08-22 DIAGNOSIS — Z923 Personal history of irradiation: Secondary | ICD-10-CM | POA: Insufficient documentation

## 2011-08-22 HISTORY — DX: Personal history of irradiation: Z92.3

## 2011-08-22 NOTE — Progress Notes (Signed)
Please see the Nurse Progress Note in the MD Initial Consult Encounter for this patient. 

## 2011-08-22 NOTE — Progress Notes (Signed)
Pt taking physical therapy for L3 back fx. She takes Tylenol prn for back pain w/good relief.

## 2011-08-25 ENCOUNTER — Ambulatory Visit: Payer: Medicare Other | Admitting: Physician Assistant

## 2011-08-25 ENCOUNTER — Telehealth: Payer: Self-pay | Admitting: Oncology

## 2011-08-25 ENCOUNTER — Other Ambulatory Visit: Payer: Self-pay | Admitting: Oncology

## 2011-08-25 NOTE — Telephone Encounter (Signed)
The patient asked to see me Friday about an appointment she had scheduled with Dr. Mitzi Hansen.   After investigation- Dr. Darnelle Catalan had referred the patient to Dr. Titus Dubin for kyphoplasty.   She has been having significant back pain and has a fracture.   Dr Darnelle Catalan would like the patient to have a MRI of her back and then see IR to discuss her options.   Friday, the patient shared that her primary has sent her to Physical Therapy and it is getting better.   Today when I talked with her by phone, she said she is just fooling herself and it really is not helping.   So I suggested the MRI as Dr. Darnelle Catalan had suggested.  The patient said that she does not want to proceed with a mammogram until she learns more about kyphoplasty.   I spelled it for her so she can look it up, and encouraged her to either 1) come in and see Amy Berry or 2) talk to her primary doctor about this.   She wants to see Dr. Darnelle Catalan.  I explained to her that Dr. Darnelle Catalan will be out for a couple weeks- but she asked to be scheduled with him so I requested scheduling to call her.    Pt expressed gratitude for my follow up phone call.

## 2011-08-26 ENCOUNTER — Telehealth: Payer: Self-pay | Admitting: *Deleted

## 2011-08-26 NOTE — Progress Notes (Signed)
NARRATIVE:  Ms. Harewood was seen today for presumed follow up or re- evaluation.  It was not clear why she presented back today, and she was unclear about this as well.  After further exploring this issue, it appears that the patient was supposed to be seen by Interventional Radiology rather than Radiation Oncology.  Therefore, the patient will return on a p.r.n. basis, and I have asked for the patient not to be billed for this visit since this was done in error.    ______________________________ Radene Gunning, M.D., Ph.D. JSM/MEDQ  D:  08/26/2011  T:  08/26/2011  Job:  [id number]

## 2011-08-26 NOTE — Telephone Encounter (Signed)
patient confirmed over the phone the new date and time 

## 2011-08-28 ENCOUNTER — Encounter: Payer: Medicare Other | Admitting: Physical Therapy

## 2011-09-12 ENCOUNTER — Ambulatory Visit (HOSPITAL_BASED_OUTPATIENT_CLINIC_OR_DEPARTMENT_OTHER): Payer: Medicare Other | Admitting: Oncology

## 2011-09-12 ENCOUNTER — Telehealth: Payer: Self-pay | Admitting: *Deleted

## 2011-09-12 VITALS — BP 135/75 | HR 81 | Temp 97.6°F | Ht 60.5 in | Wt 168.8 lb

## 2011-09-12 DIAGNOSIS — M545 Low back pain, unspecified: Secondary | ICD-10-CM

## 2011-09-12 DIAGNOSIS — Z170 Estrogen receptor positive status [ER+]: Secondary | ICD-10-CM

## 2011-09-12 DIAGNOSIS — C50919 Malignant neoplasm of unspecified site of unspecified female breast: Secondary | ICD-10-CM

## 2011-09-12 DIAGNOSIS — Z79811 Long term (current) use of aromatase inhibitors: Secondary | ICD-10-CM

## 2011-09-12 DIAGNOSIS — Z853 Personal history of malignant neoplasm of breast: Secondary | ICD-10-CM

## 2011-09-12 NOTE — Telephone Encounter (Signed)
gave patient appointment for 02-2012 printed out calendar and gave to the patient 

## 2011-09-13 NOTE — Progress Notes (Signed)
ID: [name]   DOB: September 07, 1929  MR#: [id number]  WUJ#:[id number]  HISTORY OF PRESENT ILLNESS: [name] had screening mammography 06/11/2010 which showed an area of concern in the right breast.  The patient was recalled for additional views on December 15th.  Dr. Tilda Burrow was able to demonstrate a persistent density in the upper outer quadrant of the right breast with spiculation.  Ultrasonography showed this to measure 1.7 cm and to be sufficiency suspicious to warrant biopsy which was performed on December 20th.  The pathology from this procedure ([id number]) showed an invasive carcinoma which may be lobular or may be ductal with lobular features.  It was ER 83% and PR 85% positive with a very low proliferation marker at 9%.  There was no evidence of Her-2 amplification with a ratio of 1.33.    Bilateral breast MRIs were obtained December 29th, 2011.  This showed in the upper central right breast an irregular mass like area of enhancement measuring maximally 1.9 cm.  This was associated with a hematoma but there was no evidence of multifocality or multicentricity.  No suspicious axillary or internal mammary lymph nodes and nothing seen in the left breast.  The patient had definitive Right lumpectomy February 2012, with results as detailed below.  INTERVAL HISTORY: [name] continues to have significant low back pain. We've referred her for plain back films, which she initially refused, but eventually accepted, and this showed 07/11/2011) anterior compression of L3, with approximately 10% loss of height. We discussed MRI and referral to interventional radiology for kyphoplasty. For reasons that are unclear she ended up seeing radiation oncology, but after we straightened out that misunderstanding, she decided against proceeding 2 MRI. She did agree to alendronate, which she is tolerating without side effects, and she is being very observant with her calcium and vitamin D intake.  REVIEW OF SYSTEMS: Aside  from the low back pain, which is limiting to her, she describes herself as mildly fatigued. She has been able to travel to [place], and is planning trips to [place] and other places in the coming months. She has some urinary incontinence which is not a new finding and in general aside from the back pain a detailed review of systems is stable  PAST MEDICAL HISTORY: Past Medical History  Diagnosis Date  . Stroke 03/18/03  . Breast lump   . Bruises easily   . Thyroid disease   . Hypertension   . Heart disease   . Incontinence   . Wears glasses   . Atrial fibrillation   . Hypercholesterolemia   . Osteopenia   . Cancer   . Breast cancer 06/19/10 biopsy     right, inv mammary, ER/PR +, hER2 -  . Breast CA 08/06/10    R lumpectomy  . Hx of radiation therapy 09/04/10 to 10/02/10    R breast  Significant for atrial fibrillation which has been present for about 7 years.  The patient is on chronic coumadinization for this.  She tells me she had a stroke 7 years ago when the atrial fibrillation first developed.  She has no motor residuals for this but she does have not as good a sense of balance as before, she tells me; although there have been no recent falls.  She has hypercholesterolemia, hypothyroidism, hypertension.  She is status post bilateral cataract surgery, status post bilateral knee replacement, history of tonsillectomy and adenoidectomy.  History of appendectomy.  History of hysterectomy with unilateral salpingo-oophorectomy and history of osteopenia.  PAST SURGICAL HISTORY: Past Surgical History  Procedure Date  . Appendectomy   . Ovarian cyst surgery   . Replacement total knee bilateral 08/12/10  . Breast lumpectomy 06/2008  . Breast lumpectomy 08/06/2010    R, INV LOBULAR, DCIS, ER/PR +, HER2-  . Cataract extraction, bilateral   . Tonsillectomy and adenoidectomy   . Abdominal hysterectomy     unilat bso    FAMILY HISTORY Family History  Problem Relation Age of Onset  . Cancer  Brother     esophagus  . Cancer Mother     breast    The patient's father died from a myocardial infarction at the age of 81.  The patient's mother died from breast cancer at the age of 59.  The patient has one sister alive at age 14 and a brother who died from esophageal cancer at age 63.  There are no other family members with breast or ovarian cancer to her knowledge.    GYNECOLOGIC HISTORY: She is GX P2.  Menarche age 67.  First pregnancy to term at age 55.  Hysterectomy in 1962.  She never took hormone replacement therapy.    SOCIAL HISTORY:    ADVANCED DIRECTIVES:  HEALTH MAINTENANCE: History  Substance Use Topics  . Smoking status: Never Smoker   . Smokeless tobacco: Not on file  . Alcohol Use: No     Colonoscopy: due  PAP: "not any more"  Bone density: 2009 at [facility], "osteopenia"  Lipid panel: "controlled"  No Known Allergies  Current Outpatient Prescriptions  Medication Sig Dispense Refill  . acetaminophen (TYLENOL) 325 MG tablet Take 650 mg by mouth every 6 (six) hours as needed.      Marland Kitchen alendronate (FOSAMAX) 70 MG tablet Take 1 tablet (70 mg total) by mouth every 7 (seven) days. Take with a full glass of water on an empty stomach.  12 tablet  1 yr  . amLODipine (NORVASC) 5 MG tablet Take 5 mg by mouth daily.        . calcium-vitamin D (OSCAL WITH D) 500-200 MG-UNIT per tablet Take 1 tablet by mouth daily.        . hydrochlorothiazide (HYDRODIURIL) 25 MG tablet Take 25 mg by mouth daily.        Marland Kitchen letrozole (FEMARA) 2.5 MG tablet daily.      Marland Kitchen levothyroxine (SYNTHROID, LEVOTHROID) 50 MCG tablet Take 50 mcg by mouth daily.        . Multiple Vitamin (MULTIVITAMIN) capsule Take 1 capsule by mouth daily.        . pravastatin (PRAVACHOL) 10 MG tablet Take 10 mg by mouth daily.        Marland Kitchen warfarin (COUMADIN) 5 MG tablet Take 5 mg by mouth daily. Plus 1/2 M-W-F       . doxycycline (DORYX) 100 MG DR capsule Take 100 mg by mouth 3 (three) times a week.          OBJECTIVE:  Elderly white woman who appears anxious Filed Vitals:   09/12/11 1018  BP: 135/75  Pulse: 81  Temp: 97.6 F (36.4 C)     Body mass index is 32.42 kg/(m^2).    ECOG FS: 2  Sclerae unicteric Oropharynx clear No peripheral adenopathy and specifically no right axillary adenopathy Lungs no rales or rhonchi Heart regular rate and rhythm Abd benign MSK kyphosis and scoliosis, mild tenderness over the lumbar area, no peripheral edema Neuro: nonfocal Breasts: The right breast is status post lumpectomy. No evidence of local  recurrence. The left breast is unremarkable  LAB RESULTS: Lab Results  Component Value Date   WBC 3.8* 06/09/2011   NEUTROABS 2.5 06/09/2011   HGB 14.2 06/09/2011   HCT 41.1 06/09/2011   MCV 89.4 06/09/2011   PLT 169 06/09/2011      Chemistry      Component Value Date/Time   NA 142 06/09/2011 1325   NA 142 06/09/2011 1325   K 4.1 06/09/2011 1325   K 4.1 06/09/2011 1325   CL 105 06/09/2011 1325   CL 105 06/09/2011 1325   CO2 27 06/09/2011 1325   CO2 27 06/09/2011 1325   BUN 14 06/09/2011 1325   BUN 14 06/09/2011 1325   CREATININE 0.74 06/09/2011 1325   CREATININE 0.74 06/09/2011 1325      Component Value Date/Time   CALCIUM 9.1 06/09/2011 1325   CALCIUM 9.1 06/09/2011 1325   ALKPHOS 68 06/09/2011 1325   ALKPHOS 68 06/09/2011 1325   AST 25 06/09/2011 1325   AST 25 06/09/2011 1325   ALT 17 06/09/2011 1325   ALT 17 06/09/2011 1325   BILITOT 0.6 06/09/2011 1325   BILITOT 0.6 06/09/2011 1325       Lab Results  Component Value Date   LABCA2 30 06/09/2011    No components found with this basename: [id number]    No results found for this basename: INR:1;PROTIME:1 in the last 168 hours  Urinalysis    Component Value Date/Time   COLORURINE YELLOW 08/05/2010 1358   APPEARANCEUR CLEAR 08/05/2010 1358   LABSPEC 1.016 08/05/2010 1358   PHURINE 7.0 08/05/2010 1358   HGBUR NEGATIVE 08/05/2010 1358   BILIRUBINUR NEGATIVE 08/05/2010 1358   KETONESUR NEGATIVE  08/05/2010 1358   PROTEINUR NEGATIVE 08/05/2010 1358   UROBILINOGEN 1.0 08/05/2010 1358   NITRITE NEGATIVE 08/05/2010 1358   LEUKOCYTESUR NEGATIVE MICROSCOPIC NOT DONE ON URINES WITH NEGATIVE PROTEIN, BLOOD, LEUKOCYTES, NITRITE, OR GLUCOSE <1000 mg/dL. 08/05/2010 1358       STUDIES: No new results found.  ASSESSMENT:  78 year-old [place] woman status post right lumpectomy February 2012 for a T1c NX, Stage I invasive lobular carcinoma, strongly ER PR positive, HER2-neu negative with MIB-1 of 9%.  Status post radiation therapy, completed 10/02/2010, after which she began on letrozole 2.5 mg daily, the goal being to complete a total of 5 years.   PLAN: We again discussed the possibility of kyphoplasty, and again as she is very negative about this. I am hopeful with a little bit more time the pain may take care of itself, is frequently occurs with compression fractures. She is tolerating the alendronate well, and long-term this should be of help. We left it that if her back pain has not significantly improved over the next month or 2 she will consider an MRI and kyphoplasty referral. Otherwise she will return to see [place] in September for routine followup. She knows to call for any problems that may develop before the next visit  [name] C    09/13/2011

## 2011-09-15 ENCOUNTER — Telehealth: Payer: Self-pay | Admitting: Oncology

## 2011-09-15 NOTE — Telephone Encounter (Signed)
S/w the pt and she is aware of the cancelled lab appt for may

## 2011-10-24 ENCOUNTER — Encounter: Payer: Self-pay | Admitting: *Deleted

## 2011-10-24 NOTE — Progress Notes (Unsigned)
Pt reports that she "is ready to schedule the recommended MRI" due to severe back pain

## 2011-10-27 ENCOUNTER — Other Ambulatory Visit: Payer: Self-pay | Admitting: *Deleted

## 2011-10-28 ENCOUNTER — Telehealth: Payer: Self-pay | Admitting: Oncology

## 2011-10-28 NOTE — Telephone Encounter (Signed)
S/w the pt and she is aware of her mri appt and the lab in may.

## 2011-10-29 ENCOUNTER — Other Ambulatory Visit: Payer: Self-pay | Admitting: *Deleted

## 2011-10-29 DIAGNOSIS — M549 Dorsalgia, unspecified: Secondary | ICD-10-CM

## 2011-11-03 ENCOUNTER — Other Ambulatory Visit: Payer: Medicare Other | Admitting: Lab

## 2011-11-04 ENCOUNTER — Other Ambulatory Visit (HOSPITAL_BASED_OUTPATIENT_CLINIC_OR_DEPARTMENT_OTHER): Payer: Medicare Other | Admitting: Lab

## 2011-11-04 ENCOUNTER — Ambulatory Visit (HOSPITAL_COMMUNITY)
Admission: RE | Admit: 2011-11-04 | Discharge: 2011-11-04 | Disposition: A | Discharge status: Home / Self Care | Payer: Medicare Other | Source: Ambulatory Visit | Attending: Oncology | Admitting: Oncology

## 2011-11-04 DIAGNOSIS — C50919 Malignant neoplasm of unspecified site of unspecified female breast: Secondary | ICD-10-CM

## 2011-11-04 DIAGNOSIS — IMO0002 Reserved for concepts with insufficient information to code with codable children: Secondary | ICD-10-CM | POA: Insufficient documentation

## 2011-11-04 DIAGNOSIS — S32009A Unspecified fracture of unspecified lumbar vertebra, initial encounter for closed fracture: Secondary | ICD-10-CM | POA: Insufficient documentation

## 2011-11-04 DIAGNOSIS — X58XXXA Exposure to other specified factors, initial encounter: Secondary | ICD-10-CM | POA: Insufficient documentation

## 2011-11-04 DIAGNOSIS — M5144 Schmorl's nodes, thoracic region: Secondary | ICD-10-CM | POA: Insufficient documentation

## 2011-11-04 DIAGNOSIS — M545 Low back pain, unspecified: Secondary | ICD-10-CM | POA: Insufficient documentation

## 2011-11-04 DIAGNOSIS — Z853 Personal history of malignant neoplasm of breast: Secondary | ICD-10-CM

## 2011-11-04 DIAGNOSIS — M5137 Other intervertebral disc degeneration, lumbosacral region: Secondary | ICD-10-CM | POA: Insufficient documentation

## 2011-11-04 DIAGNOSIS — M51379 Other intervertebral disc degeneration, lumbosacral region without mention of lumbar back pain or lower extremity pain: Secondary | ICD-10-CM | POA: Insufficient documentation

## 2011-11-04 DIAGNOSIS — M810 Age-related osteoporosis without current pathological fracture: Secondary | ICD-10-CM | POA: Insufficient documentation

## 2011-11-04 LAB — COMPREHENSIVE METABOLIC PANEL
ALT: 19 U/L (ref 0–35)
AST: 25 U/L (ref 0–37)
Albumin: 4.1 g/dL (ref 3.5–5.2)
Alkaline Phosphatase: 63 U/L (ref 39–117)
Calcium: 9.1 mg/dL (ref 8.4–10.5)
Chloride: 102 mEq/L (ref 96–112)
Potassium: 3.6 mEq/L (ref 3.5–5.3)
Sodium: 141 mEq/L (ref 135–145)
Total Protein: 6.7 g/dL (ref 6.0–8.3)

## 2011-11-04 LAB — CBC WITH DIFFERENTIAL/PLATELET
BASO%: 0.8 % (ref 0.0–2.0)
EOS%: 2 % (ref 0.0–7.0)
HCT: 42.3 % (ref 34.8–46.6)
MCH: 30.6 pg (ref 25.1–34.0)
MCHC: 34.4 g/dL (ref 31.5–36.0)
MONO#: 0.3 10*3/uL (ref 0.1–0.9)
NEUT%: 67.8 % (ref 38.4–76.8)
RBC: 4.76 10*6/uL (ref 3.70–5.45)
RDW: 13.5 % (ref 11.2–14.5)
WBC: 4.5 10*3/uL (ref 3.9–10.3)
lymph#: 1 10*3/uL (ref 0.9–3.3)
nRBC: 0 % (ref 0–0)

## 2011-11-04 MED ORDER — GADOBENATE DIMEGLUMINE 529 MG/ML IV SOLN
15.0000 mL | Freq: Once | INTRAVENOUS | Status: AC | PRN
  Administered 2011-11-04: 15 mL via INTRAVENOUS

## 2011-11-06 ENCOUNTER — Telehealth: Payer: Self-pay | Admitting: *Deleted

## 2011-11-06 ENCOUNTER — Other Ambulatory Visit: Payer: Self-pay | Admitting: *Deleted

## 2011-11-06 DIAGNOSIS — C50919 Malignant neoplasm of unspecified site of unspecified female breast: Secondary | ICD-10-CM

## 2011-11-06 NOTE — Telephone Encounter (Signed)
This RN called to Dr Ferd Glassing in IR per need to proceed with kyphoplasty evaluation per MRI .  Noted referral from Feb still in system and message left on identified VM of Janalyn Harder inquiring to follow up and let this office know if new referral needed.

## 2011-11-10 ENCOUNTER — Ambulatory Visit: Payer: Medicare Other | Admitting: Oncology

## 2011-11-17 ENCOUNTER — Other Ambulatory Visit: Payer: Self-pay | Admitting: Physician Assistant

## 2011-11-17 ENCOUNTER — Telehealth (HOSPITAL_COMMUNITY): Payer: Self-pay

## 2011-11-17 DIAGNOSIS — Z853 Personal history of malignant neoplasm of breast: Secondary | ICD-10-CM

## 2011-11-17 DIAGNOSIS — M549 Dorsalgia, unspecified: Secondary | ICD-10-CM

## 2011-11-17 NOTE — Telephone Encounter (Signed)
Spoke with Jan from Dr. Princella Pellegrini office.  I advised her that I have not seen an order for this pt.  i have spoken with Crystal 3x in ref to getting an order not a ref.  I advised Jan that I have checked everyday for an order for this pt.

## 2011-11-18 ENCOUNTER — Other Ambulatory Visit: Payer: Self-pay | Admitting: Oncology

## 2011-11-18 NOTE — Progress Notes (Signed)
Encounter addended by: Glennie Hawk, RN on: 11/18/2011  2:43 PM<BR>     Documentation filed: Charges VN

## 2011-11-20 ENCOUNTER — Ambulatory Visit (HOSPITAL_COMMUNITY)
Admission: RE | Admit: 2011-11-20 | Discharge: 2011-11-20 | Disposition: A | Discharge status: Home / Self Care | Payer: Medicare Other | Source: Ambulatory Visit | Attending: Physician Assistant | Admitting: Physician Assistant

## 2011-11-20 ENCOUNTER — Telehealth (HOSPITAL_COMMUNITY): Payer: Self-pay

## 2011-11-20 DIAGNOSIS — Z853 Personal history of malignant neoplasm of breast: Secondary | ICD-10-CM

## 2011-11-20 DIAGNOSIS — M549 Dorsalgia, unspecified: Secondary | ICD-10-CM

## 2011-11-20 NOTE — Telephone Encounter (Signed)
Mrs. Garton will call when she is ready to proceed.

## 2011-11-26 ENCOUNTER — Other Ambulatory Visit (HOSPITAL_COMMUNITY): Payer: Self-pay | Admitting: Interventional Radiology

## 2011-11-26 DIAGNOSIS — IMO0002 Reserved for concepts with insufficient information to code with codable children: Secondary | ICD-10-CM

## 2011-12-02 ENCOUNTER — Other Ambulatory Visit: Payer: Self-pay | Admitting: Radiology

## 2011-12-09 ENCOUNTER — Encounter (HOSPITAL_COMMUNITY): Payer: Self-pay | Admitting: Pharmacy Technician

## 2011-12-12 ENCOUNTER — Encounter (HOSPITAL_COMMUNITY): Payer: Self-pay

## 2011-12-12 ENCOUNTER — Other Ambulatory Visit (HOSPITAL_COMMUNITY): Payer: Self-pay | Admitting: Interventional Radiology

## 2011-12-12 ENCOUNTER — Ambulatory Visit (HOSPITAL_COMMUNITY)
Admission: RE | Admit: 2011-12-12 | Discharge: 2011-12-12 | Disposition: A | Discharge status: Home / Self Care | Payer: Medicare Other | Source: Ambulatory Visit | Attending: Interventional Radiology | Admitting: Interventional Radiology

## 2011-12-12 DIAGNOSIS — M8448XA Pathological fracture, other site, initial encounter for fracture: Secondary | ICD-10-CM | POA: Insufficient documentation

## 2011-12-12 DIAGNOSIS — Z8673 Personal history of transient ischemic attack (TIA), and cerebral infarction without residual deficits: Secondary | ICD-10-CM | POA: Insufficient documentation

## 2011-12-12 DIAGNOSIS — I10 Essential (primary) hypertension: Secondary | ICD-10-CM | POA: Insufficient documentation

## 2011-12-12 DIAGNOSIS — E7800 Pure hypercholesterolemia, unspecified: Secondary | ICD-10-CM | POA: Insufficient documentation

## 2011-12-12 DIAGNOSIS — IMO0002 Reserved for concepts with insufficient information to code with codable children: Secondary | ICD-10-CM

## 2011-12-12 DIAGNOSIS — I4891 Unspecified atrial fibrillation: Secondary | ICD-10-CM | POA: Insufficient documentation

## 2011-12-12 DIAGNOSIS — Z853 Personal history of malignant neoplasm of breast: Secondary | ICD-10-CM | POA: Insufficient documentation

## 2011-12-12 LAB — CBC
HCT: 43.7 % (ref 36.0–46.0)
Hemoglobin: 15.1 g/dL — ABNORMAL HIGH (ref 12.0–15.0)
MCV: 87.6 fL (ref 78.0–100.0)
RBC: 4.99 MIL/uL (ref 3.87–5.11)
RDW: 13.3 % (ref 11.5–15.5)
WBC: 4.1 10*3/uL (ref 4.0–10.5)

## 2011-12-12 LAB — BASIC METABOLIC PANEL
CO2: 30 mEq/L (ref 19–32)
Chloride: 101 mEq/L (ref 96–112)
Creatinine, Ser: 0.79 mg/dL (ref 0.50–1.10)
GFR calc Af Amer: 89 mL/min — ABNORMAL LOW (ref >90)
Potassium: 3 mEq/L — ABNORMAL LOW (ref 3.5–5.1)
Sodium: 142 mEq/L (ref 135–145)

## 2011-12-12 LAB — APTT: aPTT: 32 seconds (ref 24–37)

## 2011-12-12 LAB — PROTIME-INR: INR: 1.03 (ref 0.00–1.49)

## 2011-12-12 MED ORDER — CEFAZOLIN SODIUM 1-5 GM-% IV SOLN
1.0000 g | Freq: Once | INTRAVENOUS | Status: AC
  Administered 2011-12-12: 1 g via INTRAVENOUS
  Filled 2011-12-12: qty 50

## 2011-12-12 MED ORDER — TOBRAMYCIN SULFATE 1.2 G IJ SOLR
INTRAMUSCULAR | Status: AC
  Filled 2011-12-12: qty 1.2

## 2011-12-12 MED ORDER — HYDROCODONE-ACETAMINOPHEN 5-325 MG PO TABS: 1.0000 | ORAL_TABLET | Freq: Four times a day (QID) | ORAL | Status: DC | PRN

## 2011-12-12 MED ORDER — FENTANYL CITRATE 0.05 MG/ML IJ SOLN
INTRAMUSCULAR | Status: AC
  Filled 2011-12-12: qty 4

## 2011-12-12 MED ORDER — FENTANYL CITRATE 0.05 MG/ML IJ SOLN
INTRAMUSCULAR | Status: DC | PRN
  Administered 2011-12-12 (×4): 25 ug via INTRAVENOUS

## 2011-12-12 MED ORDER — SODIUM CHLORIDE 0.9 % IV SOLN: INTRAVENOUS | Status: AC

## 2011-12-12 MED ORDER — SODIUM CHLORIDE 0.9 % IV SOLN
Freq: Once | INTRAVENOUS | Status: AC
  Administered 2011-12-12: 1000 mL via INTRAVENOUS

## 2011-12-12 MED ORDER — IOHEXOL 300 MG/ML  SOLN: 50.0000 mL | Freq: Once | INTRAMUSCULAR | Status: AC | PRN

## 2011-12-12 MED ORDER — ACETAMINOPHEN 500 MG PO TABS
1000.0000 mg | ORAL_TABLET | Freq: Four times a day (QID) | ORAL | Status: DC | PRN
  Filled 2011-12-12: qty 2

## 2011-12-12 MED ORDER — MIDAZOLAM HCL 5 MG/5ML IJ SOLN
INTRAMUSCULAR | Status: DC | PRN
  Administered 2011-12-12 (×4): 1 mg via INTRAVENOUS

## 2011-12-12 MED ORDER — MIDAZOLAM HCL 2 MG/2ML IJ SOLN
INTRAMUSCULAR | Status: AC
  Filled 2011-12-12: qty 4

## 2011-12-12 MED ORDER — HYDROMORPHONE HCL PF 1 MG/ML IJ SOLN
INTRAMUSCULAR | Status: AC
  Filled 2011-12-12: qty 1

## 2011-12-12 MED ORDER — HYDROCODONE-ACETAMINOPHEN 5-325 MG PO TABS
ORAL_TABLET | ORAL | Status: AC
  Administered 2011-12-12: 1
  Filled 2011-12-12: qty 1

## 2011-12-12 NOTE — H&P (Signed)
Chief Complaint: Back pain, L3 comp fx Referring Physician:Dr. Magrinat HPI: [name] is an 78 y.o. female with back pain that has not improved. Workup found an L3 comp fx and after evaluation by Dr. Corliss Skains, she is scheduled for L3 kyphoplasty. She has been well otherwise, no new illnesses or c/o. She has been off her Coumadin for 6 days.  Past Medical History:  Past Medical History  Diagnosis Date  . Stroke 03/18/03  . Breast lump   . Bruises easily   . Thyroid disease   . Hypertension   . Heart disease   . Incontinence   . Wears glasses   . Atrial fibrillation   . Hypercholesterolemia   . Osteopenia   . Cancer   . Breast cancer 06/19/10 biopsy     right, inv mammary, ER/PR +, hER2 -  . Breast CA 08/06/10    R lumpectomy  . Hx of radiation therapy 09/04/10 to 10/02/10    R breast    Past Surgical History:  Past Surgical History  Procedure Date  . Appendectomy   . Ovarian cyst surgery   . Replacement total knee bilateral 08/12/10  . Breast lumpectomy 06/2008  . Breast lumpectomy 08/06/2010    R, INV LOBULAR, DCIS, ER/PR +, HER2-  . Cataract extraction, bilateral   . Tonsillectomy and adenoidectomy   . Abdominal hysterectomy     unilat bso    Family History:  Family History  Problem Relation Age of Onset  . Cancer Brother     esophagus  . Cancer Mother     breast    Social History:  reports that she has never smoked. She does not have any smokeless tobacco history on file. She reports that she does not drink alcohol or use illicit drugs.  Allergies: No Known Allergies  Medications: Tylenol, Fosamax, Norvasc, Os Cal,  hydrochlorothiazide, Femara, Synthroid, Pravachol, Coumadin,  Doxycycline.   ROS: Review of Systems - History obtained from the patient General ROS: negative ENT ROS: negative Respiratory ROS: no cough, shortness of breath, or wheezing Cardiovascular ROS: no chest pain or dyspnea on exertion Gastrointestinal ROS: no abdominal pain,  change in bowel habits, or black or bloody stools Genito-Urinary ROS: no dysuria, trouble voiding, or hematuria Musculoskeletal ROS: negative Neurological ROS: negative   Physical Exam: Blood pressure 121/74, pulse 55, temperature 97.1 F (36.2 C), temperature source Oral, resp. rate 18, height 5\' 2"  (1.575 m), weight 165 lb (74.844 kg), SpO2 96.00%. Body mass index is 30.18 kg/(m^2).   General Appearance:  Alert, cooperative, no distress, appears stated age  Head:  Normocephalic, without obvious abnormality, atraumatic  ENT: Unremarkable  Neck: Supple, symmetrical, trachea midline, no adenopathy, thyroid: not enlarged, symmetric, no tenderness/mass/nodules  Lungs:   Clear to auscultation bilaterally, no w/r/r, respirations unlabored without use of accessory muscles.  Chest Wall:  No tenderness or deformity  Heart:  Regular rate and rhythm, S1, S2 normal, no murmur, rub or gallop. Carotids 2+ without bruit.  Abdomen:   Soft, non-tender, non distended. Bowel sounds active all four quadrants,  no masses, no organomegaly.  Extremities: Extremities normal, atraumatic, no cyanosis or edema  Pulses: 2+ and symmetric  Skin: Skin color, texture, turgor normal, no rashes or lesions  Neurologic: Normal affect, no gross deficits.   Results for orders placed during the hospital encounter of 12/12/11 (from the past 48 hour(s))  APTT     Status: Normal   Collection Time   12/12/11  6:51 AM  Component Value Range Comment   aPTT 32  24 - 37 seconds   BASIC METABOLIC PANEL     Status: Abnormal   Collection Time   12/12/11  6:51 AM      Component Value Range Comment   Sodium 142  135 - 145 mEq/L    Potassium 3.0 (*) 3.5 - 5.1 mEq/L    Chloride 101  96 - 112 mEq/L    CO2 30  19 - 32 mEq/L    Glucose, Bld 109 (*) 70 - 99 mg/dL    BUN 12  6 - 23 mg/dL    Creatinine, Ser 1.61  0.50 - 1.10 mg/dL    Calcium 9.5  8.4 - 09.6 mg/dL    GFR calc non Af Amer 77 (*) >90 mL/min    GFR calc Af Amer 89  (*) >90 mL/min   CBC     Status: Abnormal   Collection Time   12/12/11  6:51 AM      Component Value Range Comment   WBC 4.1  4.0 - 10.5 K/uL    RBC 4.99  3.87 - 5.11 MIL/uL    Hemoglobin 15.1 (*) 12.0 - 15.0 g/dL    HCT 04.5  40.9 - 81.1 %    MCV 87.6  78.0 - 100.0 fL    MCH 30.3  26.0 - 34.0 pg    MCHC 34.6  30.0 - 36.0 g/dL    RDW 91.4  78.2 - 95.6 %    Platelets 189  150 - 400 K/uL   PROTIME-INR     Status: Normal   Collection Time   12/12/11  6:51 AM      Component Value Range Comment   Prothrombin Time 13.7  11.6 - 15.2 seconds    INR 1.03  0.00 - 1.49    No results found.  Assessment/Plan L3 compression fracture. Afib-------Off Couamdin X 6 days, INR 1.03 Discussed procedure of kyphoplasty/vertebroplasty again including risks, complications. Consent signed in chart   Brayton El PA-C 12/12/2011, 8:00 AM

## 2011-12-12 NOTE — Procedures (Signed)
S/P L3 KP  

## 2011-12-12 NOTE — Discharge Instructions (Addendum)
KYPHOPLASTY/VERTEBROPLASTY DISCHARGE INSTRUCTIONS  Medications: (check all that apply)     Resume all home medications as before procedure.       Resume your (aspirin/Plavix/Coumadin) on 12/13/11.                  Continue your pain medications as prescribed as needed.  Over the next 3-5 days, decrease your pain medication as tolerated.  Over the counter medications (i.e. Tylenol, ibuprofen, and aleve) may be substituted once severe/moderate pain symptoms have subsided.   Wound Care:   Bandages may be removed the day following your procedure.  You may get your incision wet once bandages are removed.  Bandaids may be used to cover the incisions until scab formation.  Topical ointments are optional.    If you develop a fever greater than 101 degrees, have increased skin redness at the incision sites or pus-like oozing from incisions occurring within 1 week of the procedure, contact radiology at [phone] or [phone].    Ice pack to back for 15-20 minutes 2-3 time per day for first 2-3 days post procedure.  The ice will expedite muscle healing and help with the pain from the incisions.   Activity:   Bedrest today with limited activity for 24 hours post procedure.    No driving for 48 hours.    Increase your activity as tolerated after bedrest (with assistance if necessary).    Refrain from any strenuous activity or heavy lifting (greater than 10 lbs.).   Follow up:   Contact radiology at [phone] or [phone] if any questions/concerns.    A physician assistant from radiology will contact you in approximately 1 week.    If a biopsy was performed at the time of your procedure, your referring physician should receive the results in usually 2-3 days.  No stooping,bending lifting more than 10 lbs for 2 weeks . Use walker for 2 weeks . No driving for 2 weeks  4.RTC in 2 weeks follow up

## 2011-12-15 ENCOUNTER — Telehealth (HOSPITAL_COMMUNITY): Payer: Self-pay | Admitting: *Deleted

## 2011-12-15 NOTE — Telephone Encounter (Signed)
Spoke with husband, pt had quite a bit of pain Saturday but is better now.  Encouraged to call for any questions or problems post kyphoplasty.

## 2011-12-26 ENCOUNTER — Ambulatory Visit (HOSPITAL_COMMUNITY)
Admission: RE | Admit: 2011-12-26 | Discharge: 2011-12-26 | Disposition: A | Discharge status: Home / Self Care | Payer: Medicare Other | Source: Ambulatory Visit | Attending: Interventional Radiology | Admitting: Interventional Radiology

## 2011-12-26 DIAGNOSIS — IMO0002 Reserved for concepts with insufficient information to code with codable children: Secondary | ICD-10-CM

## 2012-01-09 ENCOUNTER — Encounter (INDEPENDENT_AMBULATORY_CARE_PROVIDER_SITE_OTHER): Payer: Self-pay | Admitting: Surgery

## 2012-01-13 ENCOUNTER — Telehealth (HOSPITAL_COMMUNITY): Payer: Self-pay

## 2012-01-13 NOTE — Telephone Encounter (Signed)
I called Mrs. Yordy to see how she was doing but, I had to leave a vm asking how she was.

## 2012-01-16 ENCOUNTER — Other Ambulatory Visit (HOSPITAL_COMMUNITY): Payer: Self-pay | Admitting: Interventional Radiology

## 2012-01-16 ENCOUNTER — Telehealth (HOSPITAL_COMMUNITY): Payer: Self-pay

## 2012-01-16 DIAGNOSIS — IMO0002 Reserved for concepts with insufficient information to code with codable children: Secondary | ICD-10-CM

## 2012-01-16 NOTE — Telephone Encounter (Signed)
I spoke with Mrs. Berman this morning.  She stated that she is 6 weeks past her KP.  She is still in the same amount of pain.  She cant do any house work or stand for any length of time.  She is taking tylenol.  She stated that it only does enough to get her through the day.  It does not take the pain away.  I advised her that I will speak with Dr. Corliss Skains and give her a call back.

## 2012-01-19 ENCOUNTER — Ambulatory Visit (HOSPITAL_COMMUNITY)
Admission: RE | Admit: 2012-01-19 | Discharge: 2012-01-19 | Disposition: A | Discharge status: Home / Self Care | Payer: Medicare Other | Source: Ambulatory Visit | Attending: Interventional Radiology | Admitting: Interventional Radiology

## 2012-01-19 DIAGNOSIS — K7689 Other specified diseases of liver: Secondary | ICD-10-CM | POA: Insufficient documentation

## 2012-01-19 DIAGNOSIS — M51379 Other intervertebral disc degeneration, lumbosacral region without mention of lumbar back pain or lower extremity pain: Secondary | ICD-10-CM | POA: Insufficient documentation

## 2012-01-19 DIAGNOSIS — K449 Diaphragmatic hernia without obstruction or gangrene: Secondary | ICD-10-CM | POA: Insufficient documentation

## 2012-01-19 DIAGNOSIS — M545 Low back pain, unspecified: Secondary | ICD-10-CM | POA: Insufficient documentation

## 2012-01-19 DIAGNOSIS — IMO0002 Reserved for concepts with insufficient information to code with codable children: Secondary | ICD-10-CM

## 2012-01-19 DIAGNOSIS — M5137 Other intervertebral disc degeneration, lumbosacral region: Secondary | ICD-10-CM | POA: Insufficient documentation

## 2012-01-20 ENCOUNTER — Telehealth (HOSPITAL_COMMUNITY): Payer: Self-pay

## 2012-01-20 NOTE — Telephone Encounter (Signed)
I spoke with Mr. [name] this morning to advise him about what Dr. Corliss Skains stated.  Dr. Corliss Skains wants [name] to continue taking the tylenol and using warm heat. He wants to wait 1.5 to 2 weeks to see how she is.  At that time he will possibly repeat the MRI to re-eval

## 2012-02-02 ENCOUNTER — Telehealth (HOSPITAL_COMMUNITY): Payer: Self-pay

## 2012-02-02 NOTE — Telephone Encounter (Signed)
[name] called today to let Dr. Corliss Skains know that she is still in pain.  She doesn't want another MRI nor injections.  She wants to know what causes the edema.  She wants to wait 1 more week.  She is in extreme pain.  All she can do is stand up  That's it.  I will get with Dr. Algis Downs

## 2012-03-08 ENCOUNTER — Telehealth: Payer: Self-pay | Admitting: *Deleted

## 2012-03-08 NOTE — Telephone Encounter (Signed)
called in and requested to move her lab appointment to 03-12-2012 at 1:00

## 2012-03-11 ENCOUNTER — Other Ambulatory Visit: Payer: Medicare Other | Admitting: Lab

## 2012-03-12 ENCOUNTER — Encounter (INDEPENDENT_AMBULATORY_CARE_PROVIDER_SITE_OTHER): Payer: Self-pay | Admitting: Surgery

## 2012-03-12 ENCOUNTER — Ambulatory Visit (INDEPENDENT_AMBULATORY_CARE_PROVIDER_SITE_OTHER): Payer: Medicare Other | Admitting: Surgery

## 2012-03-12 ENCOUNTER — Other Ambulatory Visit (HOSPITAL_BASED_OUTPATIENT_CLINIC_OR_DEPARTMENT_OTHER): Payer: Medicare Other

## 2012-03-12 VITALS — BP 118/66 | HR 96 | Temp 97.6°F | Resp 18 | Ht 62.0 in | Wt 168.6 lb

## 2012-03-12 DIAGNOSIS — Z853 Personal history of malignant neoplasm of breast: Secondary | ICD-10-CM

## 2012-03-12 DIAGNOSIS — C50919 Malignant neoplasm of unspecified site of unspecified female breast: Secondary | ICD-10-CM

## 2012-03-12 LAB — COMPREHENSIVE METABOLIC PANEL (CC13)
AST: 23 U/L (ref 5–34)
Albumin: 4 g/dL (ref 3.5–5.0)
BUN: 11 mg/dL (ref 7.0–26.0)
CO2: 26 mEq/L (ref 22–29)
Calcium: 9.9 mg/dL (ref 8.4–10.4)
Chloride: 104 mEq/L (ref 98–107)
Creatinine: 0.8 mg/dL (ref 0.6–1.1)
Potassium: 3.5 mEq/L (ref 3.5–5.1)

## 2012-03-12 LAB — CBC WITH DIFFERENTIAL/PLATELET
Basophils Absolute: 0 10*3/uL (ref 0.0–0.1)
Eosinophils Absolute: 0.1 10*3/uL (ref 0.0–0.5)
HCT: 43.7 % (ref 34.8–46.6)
HGB: 15.3 g/dL (ref 11.6–15.9)
MONO#: 0.4 10*3/uL (ref 0.1–0.9)
NEUT#: 3.2 10*3/uL (ref 1.5–6.5)
NEUT%: 64.2 % (ref 38.4–76.8)
RDW: 13.5 % (ref 11.2–14.5)
WBC: 4.9 10*3/uL (ref 3.9–10.3)
lymph#: 1.2 10*3/uL (ref 0.9–3.3)

## 2012-03-12 NOTE — Patient Instructions (Signed)
Continue annual mammograms and see [place] again in a year

## 2012-03-12 NOTE — Progress Notes (Signed)
NAME: [name]       DOB: [birth year]           DATE: 03/12/2012       MRN: [id number]   [name] is a 78 y.o.Marland Kitchenfemale who presents for routine followup of her Right breast cancer ILC, Stage I diagnosed in Dec, 2011 and treated with lumpectomy, radiation and anti estrogen. She has no problems or concerns on either side.ast PFSH: She has had no significant changes since the last visit here.  ROS: There have been no significant changes since the last visit here, except for a veterbral fx causing pain  EXAM: General: The patient is alert, oriented, generally healty appearing, NAD. Mood and affect are normal.  Breasts:  The right breast shows a palpable loss of tissue in the lumpectomy site in the upper outer quadrant. It is tender. There is no dominant mass. There are mild skin changes from radiation including a little edema of the nipple areolar complex the left breast is completely normal.  Lymphatics: She has no axillary or supraclavicular adenopathy on either side.  Extremities: Full ROM of the surgical side with no lymphedema noted.  Data Reviewed: Mammogram negative  Impression: Doing well, with no evidence of recurrent cancer or new cancer  Plan: Will continue to follow up on an annual basis here.

## 2012-03-18 ENCOUNTER — Telehealth: Payer: Self-pay | Admitting: *Deleted

## 2012-03-18 ENCOUNTER — Ambulatory Visit (HOSPITAL_BASED_OUTPATIENT_CLINIC_OR_DEPARTMENT_OTHER): Payer: Medicare Other | Admitting: Oncology

## 2012-03-18 VITALS — BP 115/76 | HR 96 | Temp 98.6°F | Resp 20 | Ht 62.0 in | Wt 168.4 lb

## 2012-03-18 DIAGNOSIS — C50419 Malignant neoplasm of upper-outer quadrant of unspecified female breast: Secondary | ICD-10-CM

## 2012-03-18 DIAGNOSIS — Z170 Estrogen receptor positive status [ER+]: Secondary | ICD-10-CM

## 2012-03-18 DIAGNOSIS — Z853 Personal history of malignant neoplasm of breast: Secondary | ICD-10-CM

## 2012-03-18 NOTE — Progress Notes (Signed)
ID: Atlee Abide   DOB: January 13, 1930  MR#: [id number]  WUJ#:[id number]  HISTORY OF PRESENT ILLNESS: Margurete had screening mammography 06/11/2010 which showed an area of concern in the right breast.  The patient was recalled for additional views on December 15th.  Dr. Tilda Burrow was able to demonstrate a persistent density in the upper outer quadrant of the right breast with spiculation.  Ultrasonography showed this to measure 1.7 cm and to be sufficiency suspicious to warrant biopsy which was performed on December 20th.  The pathology from this procedure ([id number]) showed an invasive carcinoma which may be lobular or may be ductal with lobular features.  It was ER 83% and PR 85% positive with a very low proliferation marker at 9%.  There was no evidence of Her-2 amplification with a ratio of 1.33.    Bilateral breast MRIs were obtained December 29th, 2011.  This showed in the upper central right breast an irregular mass like area of enhancement measuring maximally 1.9 cm.  This was associated with a hematoma but there was no evidence of multifocality or multicentricity.  No suspicious axillary or internal mammary lymph nodes and nothing seen in the left breast.  The patient had definitive Right lumpectomy February 2012, with results as detailed below.  INTERVAL HISTORY: Timira returns today for followup of her breast cancer. Since her last visit here she had lumbar kyphoplasty, but she is very dissatisfied with that procedure. She didn't get any relief, and she has had problems communicating with the physician and his staff. She is here today specifically asking what she can do regarding all this.  REVIEW OF SYSTEMS: She is able to walk and drive, but the back pain is otherwise limiting. She can't even left a gallon of milk without the back hurting. Sometimes if she takes a deep breath it hurts. She describes herself is severely fatigued because of the pain, which interrupts her sleep as well. She tells me she  had a recent urinary tract infection. She has a history of psoriasis. Otherwise a detailed review of systems today was stable.  PAST MEDICAL HISTORY: Past Medical History  Diagnosis Date  . Stroke 03/18/03  . Breast lump   . Bruises easily   . Thyroid disease   . Hypertension   . Heart disease   . Incontinence   . Wears glasses   . Atrial fibrillation   . Hypercholesterolemia   . Osteopenia   . Cancer   . Breast cancer 06/19/10 biopsy     right, inv mammary, ER/PR +, hER2 -  . Breast CA 08/06/10    R lumpectomy  . Hx of radiation therapy 09/04/10 to 10/02/10    R breast  Significant for atrial fibrillation which has been present for about 7 years.  The patient is on chronic coumadinization for this.  She tells me she had a stroke 7 years ago when the atrial fibrillation first developed.  She has no motor residuals for this but she does have not as good a sense of balance as before, she tells me; although there have been no recent falls.  She has hypercholesterolemia, hypothyroidism, hypertension.  She is status post bilateral cataract surgery, status post bilateral knee replacement, history of tonsillectomy and adenoidectomy.  History of appendectomy.  History of hysterectomy with unilateral salpingo-oophorectomy and history of osteopenia.    PAST SURGICAL HISTORY: Past Surgical History  Procedure Date  . Appendectomy   . Ovarian cyst surgery   . Replacement total knee bilateral 08/12/10  .  Breast lumpectomy 06/2008  . Breast lumpectomy 08/06/2010    R, INV LOBULAR, DCIS, ER/PR +, HER2-  . Cataract extraction, bilateral   . Tonsillectomy and adenoidectomy   . Abdominal hysterectomy     unilat bso    FAMILY HISTORY Family History  Problem Relation Age of Onset  . Cancer Brother     esophagus  . Cancer Mother     breast  The patient's father died from a myocardial infarction at the age of 32.  The patient's mother died from breast cancer at the age of 73.  The patient has one sister  alive at age 78 and a brother who died from esophageal cancer at age 52.  There are no other family members with breast or ovarian cancer to her knowledge.    GYNECOLOGIC HISTORY: She is GX P2.  Menarche age 67.  First pregnancy to term at age 41.  Hysterectomy in 1962.  She never took hormone replacement therapy.    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 Her husband of 62 years, Onalee Hua, was an Art gallery manager. Her two sons live in [place] and [place]. The [place] son works in business. The one in [place] is in government. She has 4 grandchildren. She is not a Advice worker.   ADVANCED DIRECTIVES:  HEALTH MAINTENANCE: History  Substance Use Topics  . Smoking status: Never Smoker   . Smokeless tobacco: Not on file  . Alcohol Use: No     Colonoscopy: due  PAP: "not any more"  Bone density: 2009 at [facility], "osteopenia"  Lipid panel: "controlled"  No Known Allergies  Current Outpatient Prescriptions  Medication Sig Dispense Refill  . acetaminophen (TYLENOL) 325 MG tablet Take 650 mg by mouth 2 (two) times a week. As needed for pain      . alendronate (FOSAMAX) 70 MG tablet Take 70 mg by mouth every 7 (seven) days. On Sunday   -  Take with a full glass of water on an empty stomach.      Marland Kitchen amLODipine (NORVASC) 5 MG tablet Take 5 mg by mouth daily.        . calcium-vitamin D (OSCAL WITH D) 500-200 MG-UNIT per tablet Take 1 tablet by mouth daily.        Marland Kitchen doxycycline (DORYX) 100 MG DR capsule Take 100 mg by mouth See admin instructions. Three times a week as needed for rash      . hydrochlorothiazide (HYDRODIURIL) 25 MG tablet Take 25 mg by mouth daily.        Marland Kitchen letrozole (FEMARA) 2.5 MG tablet Take 2.5 mg by mouth daily.       Marland Kitchen levothyroxine (SYNTHROID, LEVOTHROID) 50 MCG tablet Take 50 mcg by mouth daily.        . Multiple Vitamin (MULTIVITAMIN WITH MINERALS) TABS Take 1 tablet by mouth daily.      . pravastatin (PRAVACHOL)  10 MG tablet Take 10 mg by mouth daily.        Marland Kitchen warfarin (COUMADIN) 5 MG tablet Take 2.5-5 mg by mouth daily. Take 1 tablet on Sun, Tues, Thurs, Sat and  1/2 tablet on  Mon, Wed, Fri.        OBJECTIVE: Elderly white woman who appears uncomfortable Filed Vitals:   03/18/12 1351  BP: 115/76  Pulse: 96  Temp: 98.6 F (37 C)  Resp: 20     Body mass index is 30.80 kg/(m^2).    ECOG FS: 2  Sclerae unicteric Oropharynx clear No peripheral adenopathy and specifically no right axillary adenopathy Lungs no rales or rhonchi Heart regular rate and rhythm Abd benign MSK kyphosis and scoliosis, with moderate tenderness over the lumbar area, no peripheral edema Neuro: nonfocal Breasts: The right breast is status post lumpectomy. No evidence of local recurrence. The left breast is unremarkable  LAB RESULTS: Lab Results  Component Value Date   WBC 4.9 03/12/2012   NEUTROABS 3.2 03/12/2012   HGB 15.3 03/12/2012   HCT 43.7 03/12/2012   MCV 88.1 03/12/2012   PLT 190 03/12/2012      Chemistry      Component Value Date/Time   NA 143 03/12/2012 1330   NA 142 12/12/2011 0651   K 3.5 03/12/2012 1330   K 3.0* 12/12/2011 0651   CL 104 03/12/2012 1330   CL 101 12/12/2011 0651   CO2 26 03/12/2012 1330   CO2 30 12/12/2011 0651   BUN 11.0 03/12/2012 1330   BUN 12 12/12/2011 0651   CREATININE 0.8 03/12/2012 1330   CREATININE 0.79 12/12/2011 0651      Component Value Date/Time   CALCIUM 9.9 03/12/2012 1330   CALCIUM 9.5 12/12/2011 0651   ALKPHOS 63 03/12/2012 1330   ALKPHOS 63 11/04/2011 1347   AST 23 03/12/2012 1330   AST 25 11/04/2011 1347   ALT 18 03/12/2012 1330   ALT 19 11/04/2011 1347   BILITOT 0.70 03/12/2012 1330   BILITOT 0.5 11/04/2011 1347       Lab Results  Component Value Date   LABCA2 30 06/09/2011    No components found with this basename: [id number]    No results found for this basename: INR:1;PROTIME:1 in the last 168 hours  Urinalysis    Component Value Date/Time   COLORURINE YELLOW  08/05/2010 1358   APPEARANCEUR CLEAR 08/05/2010 1358   LABSPEC 1.016 08/05/2010 1358   PHURINE 7.0 08/05/2010 1358   HGBUR NEGATIVE 08/05/2010 1358   BILIRUBINUR NEGATIVE 08/05/2010 1358   KETONESUR NEGATIVE 08/05/2010 1358   PROTEINUR NEGATIVE 08/05/2010 1358   UROBILINOGEN 1.0 08/05/2010 1358   NITRITE NEGATIVE 08/05/2010 1358   LEUKOCYTESUR NEGATIVE MICROSCOPIC NOT DONE ON URINES WITH NEGATIVE PROTEIN, BLOOD, LEUKOCYTES, NITRITE, OR GLUCOSE <1000 mg/dL. 08/05/2010 1358       STUDIES: MRI results from July 2013 reviewed  ASSESSMENT:  78 year-old [place] woman status post right lumpectomy February 2012 for a T1c NX, Stage I invasive lobular carcinoma, strongly ER PR positive, HER2-neu negative with MIB-1 of 9%.  Status post radiation therapy, completed 10/02/2010, after which she began on letrozole 2.5 mg daily, the goal being to complete a total of 5 years.   PLAN: From a breast cancer point of view she is doing fine, and tolerating both the letrozole and alendronate well. She is very distressed about the low back pain, and we discussed the question Y. is she still in pain, which even describes is worse than before. It could be simply that the kyphoplasty was not successful, and the changes seen in July could be simply reactive, postprocedure changes. On the other hand the issue was brought up whether she could have an infection in that area and I will see you again really tell without taking another look.  She is agreeable to a repeat MRI of the lumbar spine. I think she would benefit from referral to Dr. Ollen Bowl to review those MRI results and assuming there is no infection, discuss treatment options to relieve her pain. I am operationalizing both  those decisions  As far as her breast cancer is concerned she is doing fine and will see [place] again in 6 months. Her next mammography will be in December of this year.  Deshay Kirstein C    03/18/2012

## 2012-03-18 NOTE — Telephone Encounter (Signed)
Gave medical records patient's demographics and referral form to fax over the [facility]

## 2012-03-18 NOTE — Telephone Encounter (Signed)
09-08-2012 lab only at 1:00pm 09-15-2012 at 2:30pm for md  Mri of the lumbar 03-19-2012 at 9:00pm

## 2012-03-19 ENCOUNTER — Ambulatory Visit (HOSPITAL_COMMUNITY)
Admission: RE | Admit: 2012-03-19 | Discharge: 2012-03-19 | Disposition: A | Discharge status: Home / Self Care | Payer: Medicare Other | Source: Ambulatory Visit | Attending: Oncology | Admitting: Oncology

## 2012-03-19 DIAGNOSIS — M545 Low back pain, unspecified: Secondary | ICD-10-CM | POA: Insufficient documentation

## 2012-03-19 DIAGNOSIS — M47817 Spondylosis without myelopathy or radiculopathy, lumbosacral region: Secondary | ICD-10-CM | POA: Insufficient documentation

## 2012-03-19 DIAGNOSIS — Z853 Personal history of malignant neoplasm of breast: Secondary | ICD-10-CM

## 2012-03-19 DIAGNOSIS — M51379 Other intervertebral disc degeneration, lumbosacral region without mention of lumbar back pain or lower extremity pain: Secondary | ICD-10-CM | POA: Insufficient documentation

## 2012-03-19 DIAGNOSIS — M5137 Other intervertebral disc degeneration, lumbosacral region: Secondary | ICD-10-CM | POA: Insufficient documentation

## 2012-03-19 MED ORDER — GADOBENATE DIMEGLUMINE 529 MG/ML IV SOLN
15.0000 mL | Freq: Once | INTRAVENOUS | Status: AC | PRN
  Administered 2012-03-19: 15 mL via INTRAVENOUS

## 2012-03-22 ENCOUNTER — Other Ambulatory Visit: Payer: Self-pay | Admitting: *Deleted

## 2012-03-22 NOTE — Progress Notes (Signed)
Message left by Thayer Ohm- NP coordinator- with Dr Ollen Bowl- stating per referral for therapy pt needs to be cleared by IR regarding concern for infection and then Dr Ollen Bowl can evaluate.  This RN noted request per MD's dictation for pt to be evaluated by Dr Ollen Bowl for back pain not alleviated by IR- MRI was obtained post visit with reading stating no noted osteomylitis- Pt would prefer not to return to IR.  This returned call to [facility] to discuss and obtained vm. Message left per above with request to return call to this RN to assist in pt's care.

## 2012-03-24 ENCOUNTER — Telehealth: Payer: Self-pay | Admitting: *Deleted

## 2012-03-24 NOTE — Telephone Encounter (Signed)
This RN spoke with Thayer Ohm per MD referral for back pain evaluation by Dr Ollen Bowl- Per discussion of MRI obtained on 9/19 will be faxed for review by MD for appointment.  Thayer Ohm will return call to this RN.

## 2012-07-02 ENCOUNTER — Other Ambulatory Visit: Payer: Self-pay | Admitting: Oncology

## 2012-09-08 ENCOUNTER — Other Ambulatory Visit (HOSPITAL_BASED_OUTPATIENT_CLINIC_OR_DEPARTMENT_OTHER): Payer: Medicare Other | Admitting: Lab

## 2012-09-08 DIAGNOSIS — M549 Dorsalgia, unspecified: Secondary | ICD-10-CM

## 2012-09-08 LAB — COMPREHENSIVE METABOLIC PANEL (CC13)
ALT: 19 U/L (ref 0–55)
AST: 28 U/L (ref 5–34)
Albumin: 4 g/dL (ref 3.5–5.0)
CO2: 30 mEq/L — ABNORMAL HIGH (ref 22–29)
Calcium: 9.5 mg/dL (ref 8.4–10.4)
Chloride: 102 mEq/L (ref 98–107)
Creatinine: 0.9 mg/dL (ref 0.6–1.1)
Potassium: 3.6 mEq/L (ref 3.5–5.1)

## 2012-09-08 LAB — CBC WITH DIFFERENTIAL/PLATELET
BASO%: 0.7 % (ref 0.0–2.0)
Basophils Absolute: 0 10*3/uL (ref 0.0–0.1)
EOS%: 2.5 % (ref 0.0–7.0)
HCT: 42 % (ref 34.8–46.6)
HGB: 14.4 g/dL (ref 11.6–15.9)
MCH: 29.9 pg (ref 25.1–34.0)
MCHC: 34.3 g/dL (ref 31.5–36.0)
MONO#: 0.3 10*3/uL (ref 0.1–0.9)
NEUT%: 60.2 % (ref 38.4–76.8)
RDW: 13.6 % (ref 11.2–14.5)
WBC: 4.1 10*3/uL (ref 3.9–10.3)
lymph#: 1.2 10*3/uL (ref 0.9–3.3)

## 2012-09-15 ENCOUNTER — Other Ambulatory Visit: Payer: Self-pay | Admitting: Oncology

## 2012-09-15 ENCOUNTER — Ambulatory Visit (HOSPITAL_BASED_OUTPATIENT_CLINIC_OR_DEPARTMENT_OTHER): Payer: Medicare Other | Admitting: Oncology

## 2012-09-15 ENCOUNTER — Telehealth: Payer: Self-pay | Admitting: Oncology

## 2012-09-15 VITALS — BP 133/79 | HR 90 | Temp 97.0°F | Resp 20 | Ht 62.0 in | Wt 166.4 lb

## 2012-09-15 DIAGNOSIS — C50919 Malignant neoplasm of unspecified site of unspecified female breast: Secondary | ICD-10-CM

## 2012-09-15 DIAGNOSIS — C50119 Malignant neoplasm of central portion of unspecified female breast: Secondary | ICD-10-CM

## 2012-09-15 DIAGNOSIS — Z853 Personal history of malignant neoplasm of breast: Secondary | ICD-10-CM

## 2012-09-15 MED ORDER — LETROZOLE 2.5 MG PO TABS: 2.5000 mg | ORAL_TABLET | Freq: Every day | ORAL | Status: DC

## 2012-09-15 NOTE — Progress Notes (Signed)
ID: Atlee Abide   DOB: [birth year]  MR#: [id number]  UXL#:[id number]  HISTORY OF PRESENT ILLNESS: [name] had screening mammography 06/11/2010 which showed an area of concern in the right breast.  The patient was recalled for additional views on December 15th.  Dr. Tilda Burrow was able to demonstrate a persistent density in the upper outer quadrant of the right breast with spiculation.  Ultrasonography showed this to measure 1.7 cm and to be sufficiency suspicious to warrant biopsy which was performed on December 20th.  The pathology from this procedure ([id number]) showed an invasive carcinoma which may be lobular or may be ductal with lobular features.  It was ER 83% and PR 85% positive with a very low proliferation marker at 9%.  There was no evidence of Her-2 amplification with a ratio of 1.33.    Bilateral breast MRIs were obtained December 29th, 2011.  This showed in the upper central right breast an irregular mass like area of enhancement measuring maximally 1.9 cm.  This was associated with a hematoma but there was no evidence of multifocality or multicentricity.  No suspicious axillary or internal mammary lymph nodes and nothing seen in the left breast.  The patient had definitive Right lumpectomy February 2012, with results as detailed below.  INTERVAL HISTORY: [name] returns today for followup of her breast cancer. The interval history is really stable. She continues to have significant back pain. She is very distraught because of her husband's declined.  REVIEW OF SYSTEMS: She is tolerating the letrozole with no side effects that she is aware of. She describes herself is fatigued. She continues to have chronic intense low back pain. She takes Tylenol and tramadol for this. She has had no unusual headaches, no visual changes, no falls, no cough or phlegm production, no change in bowel or bladder habits. Vaginal dryness and hot flashes are not an issue. A detailed review of systems was otherwise  stable.  PAST MEDICAL HISTORY: Past Medical History  Diagnosis Date  . Stroke 03/18/03  . Breast lump   . Bruises easily   . Thyroid disease   . Hypertension   . Heart disease   . Incontinence   . Wears glasses   . Atrial fibrillation   . Hypercholesterolemia   . Osteopenia   . Cancer   . Breast cancer 06/19/10 biopsy     right, inv mammary, ER/PR +, hER2 -  . Breast CA 08/06/10    R lumpectomy  . Hx of radiation therapy 09/04/10 to 10/02/10    R breast  Significant for atrial fibrillation which has been present for about 7 years.  The patient is on chronic coumadinization for this.  She tells me she had a stroke 7 years ago when the atrial fibrillation first developed.  She has no motor residuals for this but she does have not as good a sense of balance as before, she tells me; although there have been no recent falls.  She has hypercholesterolemia, hypothyroidism, hypertension.  She is status post bilateral cataract surgery, status post bilateral knee replacement, history of tonsillectomy and adenoidectomy.  History of appendectomy.  History of hysterectomy with unilateral salpingo-oophorectomy and history of osteopenia.    PAST SURGICAL HISTORY: Past Surgical History  Procedure Laterality Date  . Appendectomy    . Ovarian cyst surgery    . Replacement total knee bilateral  08/12/10  . Breast lumpectomy  06/2008  . Breast lumpectomy  08/06/2010    R, INV LOBULAR, DCIS, ER/PR +, HER2-  .  Cataract extraction, bilateral    . Tonsillectomy and adenoidectomy    . Abdominal hysterectomy      unilat bso    FAMILY HISTORY Family History  Problem Relation Age of Onset  . Cancer Brother     esophagus  . Cancer Mother     breast  The patient's father died from a myocardial infarction at the age of 89.  The patient's mother died from breast cancer at the age of 12.  The patient has one sister alive at age 39 and a brother who died from esophageal cancer at age 27.  There are no other family  members with breast or ovarian cancer to her knowledge.    GYNECOLOGIC HISTORY: She is GX P2.  Menarche age 36.  First pregnancy to term at age 70.  Hysterectomy in 1962.  She never took hormone replacement therapy.    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 Her husband of 62 years, Onalee Hua, was an Art gallery manager. Her two sons live in [place] and [place]. The [place] son works in business. The one in [place] is in government. She has 4 grandchildren. She is not a Advice worker.   ADVANCED DIRECTIVES: In place  HEALTH MAINTENANCE: History  Substance Use Topics  . Smoking status: Never Smoker   . Smokeless tobacco: Not on file  . Alcohol Use: No     Colonoscopy: due  PAP: "not any more"  Bone density: 2009 at [facility], "osteopenia"  Lipid panel: "controlled"  No Known Allergies  Current Outpatient Prescriptions  Medication Sig Dispense Refill  . acetaminophen (TYLENOL) 325 MG tablet Take 650 mg by mouth 2 (two) times a week. As needed for pain      . alendronate (FOSAMAX) 70 MG tablet Take 70 mg by mouth every 7 (seven) days. On Sunday   -  Take with a full glass of water on an empty stomach.      Marland Kitchen amLODipine (NORVASC) 5 MG tablet Take 5 mg by mouth daily.        . calcium-vitamin D (OSCAL WITH D) 500-200 MG-UNIT per tablet Take 1 tablet by mouth daily.        Marland Kitchen doxycycline (DORYX) 100 MG DR capsule Take 100 mg by mouth See admin instructions. Three times a week as needed for rash      . hydrochlorothiazide (HYDRODIURIL) 25 MG tablet Take 25 mg by mouth daily.        Marland Kitchen letrozole (FEMARA) 2.5 MG tablet TAKE ONE TABLET BY MOUTH ONE TIME DAILY  90 tablet  3  . levothyroxine (SYNTHROID, LEVOTHROID) 50 MCG tablet Take 50 mcg by mouth daily.        . Multiple Vitamin (MULTIVITAMIN WITH MINERALS) TABS Take 1 tablet by mouth daily.      . pravastatin (PRAVACHOL) 10 MG tablet Take 10 mg by mouth daily.        Marland Kitchen warfarin (COUMADIN)  5 MG tablet Take 2.5-5 mg by mouth daily. Take 1 tablet on Sun, Tues, Thurs, Sat and  1/2 tablet on  Mon, Wed, Fri.       No current facility-administered medications for this visit.    OBJECTIVE: Elderly white woman Filed Vitals:   09/15/12 1415  BP: 133/79  Pulse: 90  Temp: 97 F (36.1 C)  Resp: 20     Body mass index is 30.43 kg/(m^2).    ECOG FS: 2  Sclerae unicteric Oropharynx clear No peripheral adenopathy  and specifically no right axillary adenopathy Lungs no rales or rhonchi Heart regular rate and rhythm Abd benign MSK kyphosis and scoliosis, with minimal tenderness to palpation over the lower thoracic area Neuro: nonfocal, well oriented Breasts: The right breast is status post lumpectomy. No evidence of local recurrence. The right axilla is The left breast is unremarkable  LAB RESULTS: Lab Results  Component Value Date   WBC 4.1 09/08/2012   NEUTROABS 2.5 09/08/2012   HGB 14.4 09/08/2012   HCT 42.0 09/08/2012   MCV 87.1 09/08/2012   PLT 169 09/08/2012      Chemistry      Component Value Date/Time   NA 142 09/08/2012 1255   NA 142 12/12/2011 0651   K 3.6 09/08/2012 1255   K 3.0* 12/12/2011 0651   CL 102 09/08/2012 1255   CL 101 12/12/2011 0651   CO2 30* 09/08/2012 1255   CO2 30 12/12/2011 0651   BUN 13.8 09/08/2012 1255   BUN 12 12/12/2011 0651   CREATININE 0.9 09/08/2012 1255   CREATININE 0.79 12/12/2011 0651      Component Value Date/Time   CALCIUM 9.5 09/08/2012 1255   CALCIUM 9.5 12/12/2011 0651   ALKPHOS 48 09/08/2012 1255   ALKPHOS 63 11/04/2011 1347   AST 28 09/08/2012 1255   AST 25 11/04/2011 1347   ALT 19 09/08/2012 1255   ALT 19 11/04/2011 1347   BILITOT 0.81 09/08/2012 1255   BILITOT 0.5 11/04/2011 1347       Lab Results  Component Value Date   LABCA2 30 06/09/2011    No components found with this basename: [id number]    No results found for this basename: INR,  in the last 168 hours  Urinalysis    Component Value Date/Time   COLORURINE YELLOW 08/05/2010  1358   APPEARANCEUR CLEAR 08/05/2010 1358   LABSPEC 1.016 08/05/2010 1358   PHURINE 7.0 08/05/2010 1358   HGBUR NEGATIVE 08/05/2010 1358   BILIRUBINUR NEGATIVE 08/05/2010 1358   KETONESUR NEGATIVE 08/05/2010 1358   PROTEINUR NEGATIVE 08/05/2010 1358   UROBILINOGEN 1.0 08/05/2010 1358   NITRITE NEGATIVE 08/05/2010 1358   LEUKOCYTESUR NEGATIVE MICROSCOPIC NOT DONE ON URINES WITH NEGATIVE PROTEIN, BLOOD, LEUKOCYTES, NITRITE, OR GLUCOSE <1000 mg/dL. 08/05/2010 1358       STUDIES: Mammogram 06/18/2012 at [facility] was unremarkable. DEXA scan 07/19/2012 at [facility] showed osteopenia, with a T score at the left femoral neck of -1.8.  ASSESSMENT:  79 y.o.  [place] woman status post right lumpectomy February 2012 for a T1c NX, Stage I invasive lobular carcinoma, strongly ER PR positive, HER2-neu negative with MIB-1 of 9%.  Status post radiation therapy, completed 10/02/2010, after which she began on letrozole 2.5 mg daily, the goal being to complete a total of 5 years.   PLAN: Monia is doing very well from a breast cancer point of view. Her biggest use the arthritis and degenerative disc disease in her back. I suggested she consider calling Dr. Gerlene Burdock ramus at for possible epidural shots I gave her his phone number. She tells me she does well with tramadol and is about to run out, but we are going to start seeing her on a once a year basis and I think all pain medicine should come from her primary care physician.  Of course she is also very concerned about her husband's mental decline. I suggested she read "The 24-hour Day", which might help her understand how things are likely to go on what she can do in the interim.  Otherwise she will start to see [place] on a once a year basis. I am encouraged that her bone density seems to be responding to bisphosphonates and she is tolerating those well. She knows to call for any problems that may develop before the next visit.  MAGRINAT,GUSTAV C    09/15/2012

## 2012-09-15 NOTE — Telephone Encounter (Signed)
Pt did not wish to schedule her annual f/u appt. Per pt she plans on moving out of [place] and may not be here next yr. Per pt if she does not move she will call to schedule annual f/u. Pt given information on how to request her records for a new provider.

## 2012-09-16 ENCOUNTER — Other Ambulatory Visit: Payer: Self-pay | Admitting: *Deleted

## 2013-01-31 ENCOUNTER — Encounter (INDEPENDENT_AMBULATORY_CARE_PROVIDER_SITE_OTHER): Payer: Self-pay | Admitting: Surgery

## 2013-04-12 ENCOUNTER — Ambulatory Visit (INDEPENDENT_AMBULATORY_CARE_PROVIDER_SITE_OTHER): Payer: Medicare Other | Admitting: Pharmacist

## 2013-04-12 DIAGNOSIS — I4891 Unspecified atrial fibrillation: Secondary | ICD-10-CM

## 2013-04-12 DIAGNOSIS — I6350 Cerebral infarction due to unspecified occlusion or stenosis of unspecified cerebral artery: Secondary | ICD-10-CM | POA: Insufficient documentation

## 2013-04-12 LAB — POCT INR: INR: 2.3

## 2013-05-12 ENCOUNTER — Encounter: Payer: Self-pay | Admitting: Interventional Cardiology

## 2013-05-17 ENCOUNTER — Ambulatory Visit (INDEPENDENT_AMBULATORY_CARE_PROVIDER_SITE_OTHER): Payer: Medicare Other | Admitting: Pharmacist

## 2013-05-17 DIAGNOSIS — I4891 Unspecified atrial fibrillation: Secondary | ICD-10-CM

## 2013-05-17 DIAGNOSIS — I6350 Cerebral infarction due to unspecified occlusion or stenosis of unspecified cerebral artery: Secondary | ICD-10-CM

## 2013-06-14 ENCOUNTER — Encounter (INDEPENDENT_AMBULATORY_CARE_PROVIDER_SITE_OTHER): Payer: Self-pay | Admitting: General Surgery

## 2013-06-21 ENCOUNTER — Encounter (INDEPENDENT_AMBULATORY_CARE_PROVIDER_SITE_OTHER): Payer: Self-pay

## 2013-06-21 ENCOUNTER — Ambulatory Visit (INDEPENDENT_AMBULATORY_CARE_PROVIDER_SITE_OTHER): Payer: Medicare Other | Admitting: Pharmacist

## 2013-06-21 DIAGNOSIS — I4891 Unspecified atrial fibrillation: Secondary | ICD-10-CM

## 2013-06-21 DIAGNOSIS — I6350 Cerebral infarction due to unspecified occlusion or stenosis of unspecified cerebral artery: Secondary | ICD-10-CM

## 2013-06-28 ENCOUNTER — Other Ambulatory Visit: Payer: Self-pay | Admitting: *Deleted

## 2013-06-28 ENCOUNTER — Telehealth: Payer: Self-pay | Admitting: *Deleted

## 2013-06-28 DIAGNOSIS — C50919 Malignant neoplasm of unspecified site of unspecified female breast: Secondary | ICD-10-CM

## 2013-06-28 DIAGNOSIS — Z853 Personal history of malignant neoplasm of breast: Secondary | ICD-10-CM

## 2013-06-28 MED ORDER — LETROZOLE 2.5 MG PO TABS: 2.5000 mg | ORAL_TABLET | Freq: Every day | ORAL | Status: DC

## 2013-06-28 NOTE — Telephone Encounter (Signed)
Pt called for an appt. gv appt for 09/12/13@ 12 noon. Pt is aware...td

## 2013-07-28 ENCOUNTER — Ambulatory Visit (INDEPENDENT_AMBULATORY_CARE_PROVIDER_SITE_OTHER): Payer: Medicare HMO | Admitting: Pharmacist

## 2013-07-28 DIAGNOSIS — Z5181 Encounter for therapeutic drug level monitoring: Secondary | ICD-10-CM

## 2013-07-28 DIAGNOSIS — I4891 Unspecified atrial fibrillation: Secondary | ICD-10-CM

## 2013-07-28 DIAGNOSIS — I6350 Cerebral infarction due to unspecified occlusion or stenosis of unspecified cerebral artery: Secondary | ICD-10-CM

## 2013-07-28 LAB — POCT INR: INR: 3.8

## 2013-08-11 ENCOUNTER — Ambulatory Visit (INDEPENDENT_AMBULATORY_CARE_PROVIDER_SITE_OTHER): Payer: Medicare HMO | Admitting: *Deleted

## 2013-08-11 DIAGNOSIS — I6350 Cerebral infarction due to unspecified occlusion or stenosis of unspecified cerebral artery: Secondary | ICD-10-CM

## 2013-08-11 DIAGNOSIS — I4891 Unspecified atrial fibrillation: Secondary | ICD-10-CM

## 2013-08-11 DIAGNOSIS — Z5181 Encounter for therapeutic drug level monitoring: Secondary | ICD-10-CM

## 2013-08-11 LAB — POCT INR: INR: 3.3

## 2013-08-12 ENCOUNTER — Encounter: Payer: Self-pay | Admitting: Oncology

## 2013-08-22 ENCOUNTER — Telehealth: Payer: Self-pay | Admitting: *Deleted

## 2013-08-22 NOTE — Telephone Encounter (Signed)
Pt called stating that she do not want to see the CP and wanted to rs. gv appt for 10/04/13 @ 2:30pm. Pt is aware...td

## 2013-08-29 ENCOUNTER — Ambulatory Visit (INDEPENDENT_AMBULATORY_CARE_PROVIDER_SITE_OTHER): Payer: Commercial Managed Care - HMO | Admitting: Pharmacist

## 2013-08-29 DIAGNOSIS — Z5181 Encounter for therapeutic drug level monitoring: Secondary | ICD-10-CM

## 2013-08-29 DIAGNOSIS — I4891 Unspecified atrial fibrillation: Secondary | ICD-10-CM

## 2013-08-29 DIAGNOSIS — I6350 Cerebral infarction due to unspecified occlusion or stenosis of unspecified cerebral artery: Secondary | ICD-10-CM

## 2013-08-29 LAB — POCT INR: INR: 2

## 2013-08-30 ENCOUNTER — Ambulatory Visit: Payer: Medicare Other | Admitting: Interventional Cardiology

## 2013-09-12 ENCOUNTER — Ambulatory Visit: Payer: Medicare Other

## 2013-09-15 ENCOUNTER — Ambulatory Visit (INDEPENDENT_AMBULATORY_CARE_PROVIDER_SITE_OTHER): Payer: Medicare HMO | Admitting: Pharmacist

## 2013-09-15 DIAGNOSIS — I4891 Unspecified atrial fibrillation: Secondary | ICD-10-CM

## 2013-09-15 DIAGNOSIS — Z5181 Encounter for therapeutic drug level monitoring: Secondary | ICD-10-CM

## 2013-09-15 DIAGNOSIS — I6350 Cerebral infarction due to unspecified occlusion or stenosis of unspecified cerebral artery: Secondary | ICD-10-CM

## 2013-09-15 LAB — POCT INR: INR: 1.3

## 2013-09-27 ENCOUNTER — Ambulatory Visit: Payer: Medicare Other | Admitting: Interventional Cardiology

## 2013-09-29 ENCOUNTER — Ambulatory Visit (INDEPENDENT_AMBULATORY_CARE_PROVIDER_SITE_OTHER): Payer: Commercial Managed Care - HMO | Admitting: Pharmacist

## 2013-09-29 DIAGNOSIS — I4891 Unspecified atrial fibrillation: Secondary | ICD-10-CM

## 2013-09-29 DIAGNOSIS — I6350 Cerebral infarction due to unspecified occlusion or stenosis of unspecified cerebral artery: Secondary | ICD-10-CM

## 2013-09-29 DIAGNOSIS — Z5181 Encounter for therapeutic drug level monitoring: Secondary | ICD-10-CM

## 2013-09-29 LAB — POCT INR: INR: 2.2

## 2013-10-04 ENCOUNTER — Ambulatory Visit (HOSPITAL_BASED_OUTPATIENT_CLINIC_OR_DEPARTMENT_OTHER): Payer: Commercial Managed Care - HMO | Admitting: Oncology

## 2013-10-04 ENCOUNTER — Telehealth: Payer: Self-pay | Admitting: Oncology

## 2013-10-04 VITALS — BP 131/80 | HR 96 | Temp 98.5°F | Resp 20 | Ht 62.0 in | Wt 168.5 lb

## 2013-10-04 DIAGNOSIS — Z853 Personal history of malignant neoplasm of breast: Secondary | ICD-10-CM

## 2013-10-04 DIAGNOSIS — Z170 Estrogen receptor positive status [ER+]: Secondary | ICD-10-CM

## 2013-10-04 DIAGNOSIS — C50412 Malignant neoplasm of upper-outer quadrant of left female breast: Secondary | ICD-10-CM | POA: Insufficient documentation

## 2013-10-04 DIAGNOSIS — C50912 Malignant neoplasm of unspecified site of left female breast: Secondary | ICD-10-CM

## 2013-10-04 DIAGNOSIS — I4891 Unspecified atrial fibrillation: Secondary | ICD-10-CM

## 2013-10-04 DIAGNOSIS — C50419 Malignant neoplasm of upper-outer quadrant of unspecified female breast: Secondary | ICD-10-CM

## 2013-10-04 MED ORDER — LETROZOLE 2.5 MG PO TABS: 2.5000 mg | ORAL_TABLET | Freq: Every day | ORAL | Status: DC

## 2013-10-04 NOTE — Telephone Encounter (Signed)
, °

## 2013-10-04 NOTE — Addendum Note (Signed)
Addended by: Laureen Abrahams on: 10/04/2013 05:36 PM   Modules accepted: Medications

## 2013-10-04 NOTE — Progress Notes (Signed)
ID: Orlene Erm   DOB: 1930-04-30  MR#: [id number]  WUJ#:[id number]  PCP: Mathews Argyle, MD GYN: SU:  OTHER MD:   HISTORY OF PRESENT ILLNESS: [name] had screening mammography 06/11/2010 which showed an area of concern in the right breast.  The patient was recalled for additional views on December 15th.  Dr. Marcelo Baldy was able to demonstrate a persistent density in the upper outer quadrant of the right breast with spiculation.  Ultrasonography showed this to measure 1.7 cm and to be sufficiency suspicious to warrant biopsy which was performed on December 20th.  The pathology from this procedure ([id number]) showed an invasive carcinoma which may be lobular or may be ductal with lobular features.  It was ER 83% and PR 85% positive with a very low proliferation marker at 9%.  There was no evidence of Her-2 amplification with a ratio of 1.33.    Bilateral breast MRIs were obtained December 29th, 2011.  This showed in the upper central right breast an irregular mass like area of enhancement measuring maximally 1.9 cm.  This was associated with a hematoma but there was no evidence of multifocality or multicentricity.  No suspicious axillary or internal mammary lymph nodes and nothing seen in the left breast.  The patient had definitive Right lumpectomy February 2012, with results as detailed below.  INTERVAL HISTORY: [name] returns today for followup of her breast cancer. The interval history is generally unremarkable. She is tolerating the letrozole with no side effects it she is aware of. She is a little bit board with her home life but has many friends and is very active. She takes walks every day.  REVIEW OF SYSTEMS:  Her back pain and psoriasis are very stable. She denies any unusual headaches, visual changes, cough, phlegm production, pleurisy, shortness of breath or change in bowel or bladder habits. There has been no fever or rash or bleeding. A detailed review of systems today was otherwise  noncontributory  PAST MEDICAL HISTORY: Past Medical History  Diagnosis Date  . Stroke 03/18/03  . Breast lump   . Bruises easily   . Thyroid disease   . Hypertension   . Heart disease   . Incontinence   . Wears glasses   . Atrial fibrillation   . Hypercholesterolemia   . Osteopenia   . Cancer   . Breast cancer 06/19/10 biopsy     right, inv mammary, ER/PR +, hER2 -  . Breast CA 08/06/10    R lumpectomy  . Hx of radiation therapy 09/04/10 to 10/02/10    R breast  Significant for atrial fibrillation which has been present for about 7 years.  The patient is on chronic coumadinization for this.  She tells me she had a stroke 7 years ago when the atrial fibrillation first developed.  She has no motor residuals for this but she does have not as good a sense of balance as before, she tells me; although there have been no recent falls.  She has hypercholesterolemia, hypothyroidism, hypertension.  She is status post bilateral cataract surgery, status post bilateral knee replacement, history of tonsillectomy and adenoidectomy.  History of appendectomy.  History of hysterectomy with unilateral salpingo-oophorectomy and history of osteopenia.    PAST SURGICAL HISTORY: Past Surgical History  Procedure Laterality Date  . Appendectomy    . Ovarian cyst surgery    . Replacement total knee bilateral  08/12/10  . Breast lumpectomy  06/2008  . Breast lumpectomy  08/06/2010    R, INV LOBULAR,  DCIS, ER/PR +, HER2-  . Cataract extraction, bilateral    . Tonsillectomy and adenoidectomy    . Abdominal hysterectomy      unilat bso    FAMILY HISTORY Family History  Problem Relation Age of Onset  . Cancer Brother     esophagus  . Cancer Mother     breast  The patient's father died from a myocardial infarction at the age of 17.  The patient's mother died from breast cancer at the age of 71.  The patient has one sister alive at age 4 and a brother who died from esophageal cancer at age 78.  There are no  other family members with breast or ovarian cancer to her knowledge.    GYNECOLOGIC HISTORY: She is GX P2.  Menarche age 36.  First pregnancy to term at age 50.  Hysterectomy in 1962.  She never took hormone replacement therapy.    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 Her husband of 53 years, [name], was an Chief Financial Officer. Her two sons live in [place] and [place]. The [place] son works in business. The one in [place] is in government. She has 4 grandchildren. She is not a Ambulance person.   ADVANCED DIRECTIVES: In place  HEALTH MAINTENANCE: History  Substance Use Topics  . Smoking status: Never Smoker   . Smokeless tobacco: Not on file  . Alcohol Use: No     Colonoscopy: due  PAP: "not any more"  Bone density: 2009 at [facility], "osteopenia"  Lipid panel: "controlled"  No Known Allergies  Current Outpatient Prescriptions  Medication Sig Dispense Refill  . acetaminophen (TYLENOL) 325 MG tablet Take 650 mg by mouth 2 (two) times a week. As needed for pain      . alendronate (FOSAMAX) 70 MG tablet TAKE 1 TABLET BY MOUTH WEEKLY AS DIRECTED  12 tablet  PRN  . amLODipine (NORVASC) 5 MG tablet Take 5 mg by mouth daily.        . calcium-vitamin D (OSCAL WITH D) 500-200 MG-UNIT per tablet Take 1 tablet by mouth daily.        Marland Kitchen doxycycline (DORYX) 100 MG DR capsule Take 100 mg by mouth See admin instructions. Three times a week as needed for rash      . hydrochlorothiazide (HYDRODIURIL) 25 MG tablet Take 25 mg by mouth daily.        Marland Kitchen letrozole (FEMARA) 2.5 MG tablet Take 1 tablet (2.5 mg total) by mouth daily.  90 tablet  3  . levothyroxine (SYNTHROID, LEVOTHROID) 50 MCG tablet Take 50 mcg by mouth daily.        . Multiple Vitamin (MULTIVITAMIN WITH MINERALS) TABS Take 1 tablet by mouth daily.      . pravastatin (PRAVACHOL) 10 MG tablet Take 10 mg by mouth daily.        Marland Kitchen warfarin (COUMADIN) 5 MG tablet Take 2.5-5 mg by mouth  daily. Take 1 tablet on Sun, Tues, Thurs, Sat and  1/2 tablet on  Mon, Wed, Fri.       No current facility-administered medications for this visit.    OBJECTIVE: Elderly white woman in no acute distres1 Filed Vitals:   10/04/13 1415  BP: 131/80  Pulse: 96  Temp: 98.5 F (36.9 C)  Resp: 20     Body mass index is 30.81 kg/(m^2).    ECOG FS: 2  Sclerae unicteric, EOMs intact Oropharynx clear and moist No cervical or supraclavicular lymphadenopathy  Lungs no rales or rhonchi Heart regular rate and rhythm Abd soft, obese, nontender, positive bowel sounds MSK kyphosis and scoliosis, but no l tenderness to palpation  Neuro: nonfocal, well oriented, brusque affect Breasts: The right breast is status post lumpectomy. There is no evidence of local recurrence. The right axilla is benign. The left breast is unremarkable  LAB RESULTS: Lab Results  Component Value Date   WBC 4.1 09/08/2012   NEUTROABS 2.5 09/08/2012   HGB 14.4 09/08/2012   HCT 42.0 09/08/2012   MCV 87.1 09/08/2012   PLT 169 09/08/2012      Chemistry      Component Value Date/Time   NA 142 09/08/2012 1255   NA 142 12/12/2011 0651   K 3.6 09/08/2012 1255   K 3.0* 12/12/2011 0651   CL 102 09/08/2012 1255   CL 101 12/12/2011 0651   CO2 30* 09/08/2012 1255   CO2 30 12/12/2011 0651   BUN 13.8 09/08/2012 1255   BUN 12 12/12/2011 0651   CREATININE 0.9 09/08/2012 1255   CREATININE 0.79 12/12/2011 0651      Component Value Date/Time   CALCIUM 9.5 09/08/2012 1255   CALCIUM 9.5 12/12/2011 0651   ALKPHOS 48 09/08/2012 1255   ALKPHOS 63 11/04/2011 1347   AST 28 09/08/2012 1255   AST 25 11/04/2011 1347   ALT 19 09/08/2012 1255   ALT 19 11/04/2011 1347   BILITOT 0.81 09/08/2012 1255   BILITOT 0.5 11/04/2011 1347       Lab Results  Component Value Date   LABCA2 30 06/09/2011    No components found with this basename: [id number]     Recent Labs Lab 09/29/13 1314  INR 2.2    Urinalysis    Component Value Date/Time   COLORURINE  YELLOW 08/05/2010 1358   APPEARANCEUR CLEAR 08/05/2010 1358   LABSPEC 1.016 08/05/2010 1358   PHURINE 7.0 08/05/2010 1358   HGBUR NEGATIVE 08/05/2010 [place] 08/05/2010 [place] 08/05/2010 [place] 08/05/2010 1358   UROBILINOGEN 1.0 08/05/2010 1358   NITRITE NEGATIVE 08/05/2010 1358   LEUKOCYTESUR NEGATIVE MICROSCOPIC NOT DONE ON URINES WITH NEGATIVE PROTEIN, BLOOD, LEUKOCYTES, NITRITE, OR GLUCOSE <1000 mg/dL. 08/05/2010 1358       STUDIES: DEXA scan 07/19/2012 at [facility] showed osteopenia, with a T score at the left femoral neck of -1.8.  ASSESSMENT:  80 y.o.  [place] woman status post right lumpectomy February 2012 for a T1c NX, Stage I invasive lobular carcinoma, strongly ER PR positive, HER2-neu negative with MIB-1 of 9%.  Status post radiation therapy, completed 10/02/2010, after which she began on letrozole 2.5 mg daily.  PLAN: [name] is doing fine from a breast cancer point of view. They had originally thought they might move to be near her son in [place], but are going to be staying in this area, where they have a lot of friends and they feel good medical care.  Just 2 more years on letrozole. She is tolerating it well as well as the alendronate. I making no changes in her treatment. She will see me again in one year. 2 years from now she will "graduate" from followup  She knows to call for problems that may develop before her next visit here.  [name] C    10/04/2013

## 2013-10-19 ENCOUNTER — Ambulatory Visit (INDEPENDENT_AMBULATORY_CARE_PROVIDER_SITE_OTHER): Payer: Medicare HMO | Admitting: Pharmacist

## 2013-10-19 DIAGNOSIS — I4891 Unspecified atrial fibrillation: Secondary | ICD-10-CM

## 2013-10-19 DIAGNOSIS — I6350 Cerebral infarction due to unspecified occlusion or stenosis of unspecified cerebral artery: Secondary | ICD-10-CM

## 2013-10-19 DIAGNOSIS — Z5181 Encounter for therapeutic drug level monitoring: Secondary | ICD-10-CM

## 2013-10-19 LAB — POCT INR: INR: 2.6

## 2013-11-18 ENCOUNTER — Ambulatory Visit (INDEPENDENT_AMBULATORY_CARE_PROVIDER_SITE_OTHER): Payer: Commercial Managed Care - HMO | Admitting: *Deleted

## 2013-11-18 DIAGNOSIS — I6350 Cerebral infarction due to unspecified occlusion or stenosis of unspecified cerebral artery: Secondary | ICD-10-CM

## 2013-11-18 DIAGNOSIS — I4891 Unspecified atrial fibrillation: Secondary | ICD-10-CM

## 2013-11-18 DIAGNOSIS — Z5181 Encounter for therapeutic drug level monitoring: Secondary | ICD-10-CM

## 2013-11-18 LAB — POCT INR: INR: 2.3

## 2013-12-08 ENCOUNTER — Other Ambulatory Visit: Payer: Self-pay

## 2013-12-08 MED ORDER — WARFARIN SODIUM 5 MG PO TABS: ORAL_TABLET | ORAL | Status: DC

## 2013-12-09 ENCOUNTER — Other Ambulatory Visit: Payer: Self-pay | Admitting: Geriatric Medicine

## 2013-12-09 DIAGNOSIS — R071 Chest pain on breathing: Secondary | ICD-10-CM

## 2013-12-15 ENCOUNTER — Ambulatory Visit
Admission: RE | Admit: 2013-12-15 | Discharge: 2013-12-15 | Disposition: A | Discharge status: Home / Self Care | Payer: Commercial Managed Care - HMO | Source: Ambulatory Visit | Attending: Geriatric Medicine | Admitting: Geriatric Medicine

## 2013-12-15 DIAGNOSIS — R071 Chest pain on breathing: Secondary | ICD-10-CM

## 2013-12-15 MED ORDER — IOHEXOL 300 MG/ML  SOLN
75.0000 mL | Freq: Once | INTRAMUSCULAR | Status: AC | PRN
  Administered 2013-12-15: 75 mL via INTRAVENOUS

## 2013-12-19 ENCOUNTER — Encounter: Payer: Self-pay | Admitting: *Deleted

## 2013-12-21 ENCOUNTER — Ambulatory Visit (INDEPENDENT_AMBULATORY_CARE_PROVIDER_SITE_OTHER): Payer: Commercial Managed Care - HMO | Admitting: *Deleted

## 2013-12-21 DIAGNOSIS — I6350 Cerebral infarction due to unspecified occlusion or stenosis of unspecified cerebral artery: Secondary | ICD-10-CM

## 2013-12-21 DIAGNOSIS — I4891 Unspecified atrial fibrillation: Secondary | ICD-10-CM

## 2013-12-21 DIAGNOSIS — Z5181 Encounter for therapeutic drug level monitoring: Secondary | ICD-10-CM

## 2013-12-21 LAB — POCT INR: INR: 2.7

## 2014-02-01 ENCOUNTER — Ambulatory Visit (INDEPENDENT_AMBULATORY_CARE_PROVIDER_SITE_OTHER): Payer: Commercial Managed Care - HMO | Admitting: Pharmacist

## 2014-02-01 DIAGNOSIS — I6350 Cerebral infarction due to unspecified occlusion or stenosis of unspecified cerebral artery: Secondary | ICD-10-CM

## 2014-02-01 DIAGNOSIS — I4891 Unspecified atrial fibrillation: Secondary | ICD-10-CM

## 2014-02-01 DIAGNOSIS — Z5181 Encounter for therapeutic drug level monitoring: Secondary | ICD-10-CM

## 2014-02-01 LAB — POCT INR: INR: 2.2

## 2014-02-16 ENCOUNTER — Other Ambulatory Visit: Payer: Self-pay | Admitting: Geriatric Medicine

## 2014-02-16 DIAGNOSIS — R1013 Epigastric pain: Secondary | ICD-10-CM

## 2014-03-09 ENCOUNTER — Ambulatory Visit
Admission: RE | Admit: 2014-03-09 | Discharge: 2014-03-09 | Disposition: A | Discharge status: Home / Self Care | Payer: Commercial Managed Care - HMO | Source: Ambulatory Visit | Attending: Geriatric Medicine | Admitting: Geriatric Medicine

## 2014-03-09 DIAGNOSIS — R1013 Epigastric pain: Secondary | ICD-10-CM

## 2014-03-13 ENCOUNTER — Ambulatory Visit (INDEPENDENT_AMBULATORY_CARE_PROVIDER_SITE_OTHER): Payer: Commercial Managed Care - HMO | Admitting: Pharmacist

## 2014-03-13 DIAGNOSIS — I4891 Unspecified atrial fibrillation: Secondary | ICD-10-CM

## 2014-03-13 DIAGNOSIS — Z5181 Encounter for therapeutic drug level monitoring: Secondary | ICD-10-CM

## 2014-03-13 DIAGNOSIS — I6350 Cerebral infarction due to unspecified occlusion or stenosis of unspecified cerebral artery: Secondary | ICD-10-CM

## 2014-03-13 LAB — POCT INR: INR: 2.3

## 2014-04-24 ENCOUNTER — Other Ambulatory Visit: Payer: Self-pay | Admitting: Interventional Cardiology

## 2014-04-24 ENCOUNTER — Ambulatory Visit (INDEPENDENT_AMBULATORY_CARE_PROVIDER_SITE_OTHER): Payer: Commercial Managed Care - HMO

## 2014-04-24 DIAGNOSIS — I4891 Unspecified atrial fibrillation: Secondary | ICD-10-CM

## 2014-04-24 DIAGNOSIS — I6350 Cerebral infarction due to unspecified occlusion or stenosis of unspecified cerebral artery: Secondary | ICD-10-CM

## 2014-04-24 DIAGNOSIS — Z5181 Encounter for therapeutic drug level monitoring: Secondary | ICD-10-CM

## 2014-04-24 DIAGNOSIS — I639 Cerebral infarction, unspecified: Secondary | ICD-10-CM

## 2014-04-24 LAB — POCT INR: INR: 2

## 2014-05-23 ENCOUNTER — Encounter: Payer: Self-pay | Admitting: Interventional Cardiology

## 2014-05-23 ENCOUNTER — Ambulatory Visit (INDEPENDENT_AMBULATORY_CARE_PROVIDER_SITE_OTHER): Payer: Commercial Managed Care - HMO | Admitting: Interventional Cardiology

## 2014-05-23 VITALS — BP 100/70 | HR 70 | Ht 62.0 in | Wt 150.8 lb

## 2014-05-23 DIAGNOSIS — I4891 Unspecified atrial fibrillation: Secondary | ICD-10-CM

## 2014-05-23 DIAGNOSIS — I10 Essential (primary) hypertension: Secondary | ICD-10-CM

## 2014-05-23 NOTE — Patient Instructions (Signed)
Your physician wants you to follow-up in: 1 year with Dr. Varanasi. You will receive a reminder letter in the mail two months in advance. If you don't receive a letter, please call our office to schedule the follow-up appointment.  Your physician recommends that you continue on your current medications as directed. Please refer to the Current Medication list given to you today.  

## 2014-05-23 NOTE — Progress Notes (Signed)
Patient ID: [name], female   DOB: April 27, 1930, 80 y.o.   MRN: [id number]    [place], [place] [place], [place]  [address] Phone: [phone] Fax:  [phone]  Date:  05/23/2014   ID:  [name], DOB 1929-07-28, MRN [id number]  PCP:  Mathews Argyle, MD      History of Present Illness: [name] is a 80 y.o. female who has had AFib. She had a stroke several years ago.  She has ben maintained on Coumadin.  No bleeding problems.  Occasional bruising.  No palpitations.  No CP or SHOB.  She walks 5 days a week, 30 minutes.  She has lost weight through increased exercise and eating less.  Fatigue has improved.  Back pain is the most limiting thing for her.  S/p compression fracture and had "cement insertion" procedure but still has residual pain.   Breast cancer diagnosed in 2011.      Wt Readings from Last 3 Encounters:  05/23/14 150 lb 12.8 oz (68.402 kg)  10/04/13 168 lb 8 oz (76.431 kg)  09/15/12 166 lb 6.4 oz (75.479 kg)     Past Medical History  Diagnosis Date  . Stroke 03/18/03  . Breast lump   . Bruises easily   . Thyroid disease   . Hypertension   . Heart disease   . Incontinence   . Wears glasses   . Atrial fibrillation   . Hypercholesterolemia   . Osteopenia   . Cancer   . Breast cancer 06/19/10 biopsy     right, inv mammary, ER/PR +, hER2 -  . Breast CA 08/06/10    R lumpectomy  . Hx of radiation therapy 09/04/10 to 10/02/10    R breast  . Arrhythmia     PAF  . Dyslipidemia   . Hypothyroidism     Current Outpatient Prescriptions  Medication Sig Dispense Refill  . alendronate (FOSAMAX) 70 MG tablet TAKE 1 TABLET BY MOUTH WEEKLY AS DIRECTED 12 tablet PRN  . amLODipine (NORVASC) 5 MG tablet Take 5 mg by mouth daily.      . calcium-vitamin D (OSCAL WITH D) 500-200 MG-UNIT per tablet Take 1 tablet by mouth daily.      . hydrochlorothiazide (HYDRODIURIL) 25 MG tablet Take 25 mg by mouth daily.      Marland Kitchen JANTOVEN 5 MG tablet TAKE AS  DIRECTED BY COUMADIN CLINIC 40 tablet 3  . letrozole (FEMARA) 2.5 MG tablet Take 1 tablet (2.5 mg total) by mouth daily. 90 tablet 3  . levothyroxine (SYNTHROID, LEVOTHROID) 50 MCG tablet Take 50 mcg by mouth daily.      . Multiple Vitamin (MULTIVITAMIN WITH MINERALS) TABS Take 1 tablet by mouth daily.    . Potassium Chloride CR (MICRO-K) 8 MEQ CPCR capsule CR     . pravastatin (PRAVACHOL) 10 MG tablet Take 10 mg by mouth daily.      . traMADol (ULTRAM) 50 MG tablet     . acetaminophen (TYLENOL) 325 MG tablet Take 650 mg by mouth 2 (two) times a week. As needed for pain     No current facility-administered medications for this visit.    Allergies:   No Known Allergies  Social History:  The patient  reports that she has never smoked. She does not have any smokeless tobacco history on file. She reports that she does not drink alcohol or use illicit drugs.   Family History:  The patient's family history includes Cancer in  her brother and mother.   ROS:  Please see the history of present illness.  No nausea, vomiting.  No fevers, chills.  No focal weakness.  No dysuria. Back pain.   All other systems reviewed and negative.   PHYSICAL EXAM: VS:  BP 100/70 mmHg  Pulse 70  Ht '5\' 2"'  (1.575 m)  Wt 150 lb 12.8 oz (68.402 kg)  BMI 27.57 kg/m2 General: Well developed, well nourished, in no acute distress HEENT: normal Neck: no JVD, no carotid bruits Cardiac:  normal S1, S2; RRR;  Lungs:  clear to auscultation bilaterally, no wheezing, rhonchi or rales Abd: soft, nontender, no hepatomegaly Ext: no edema Skin: warm and dry Neuro:   no focal abnormalities noted Psych: normal affect  EKG:     Not performed today  ASSESSMENT AND PLAN:  1. Atrial fibrillation: Paroxysmal. No symptoms of bradycardia, which she was concerned about in 2014. She is limited mostly by her back. Would continue current medications. She is not taking anything for rate slowing purposes. 2. Hypertension: Well  controlled. 3. I will see her back in a year.  Signed, Mina Marble, MD, [facility] 05/23/2014 11:10 AM

## 2014-06-05 ENCOUNTER — Ambulatory Visit (INDEPENDENT_AMBULATORY_CARE_PROVIDER_SITE_OTHER): Payer: Commercial Managed Care - HMO | Admitting: Pharmacist

## 2014-06-05 DIAGNOSIS — I4891 Unspecified atrial fibrillation: Secondary | ICD-10-CM

## 2014-06-05 DIAGNOSIS — I639 Cerebral infarction, unspecified: Secondary | ICD-10-CM

## 2014-06-05 DIAGNOSIS — Z5181 Encounter for therapeutic drug level monitoring: Secondary | ICD-10-CM

## 2014-06-05 DIAGNOSIS — I6350 Cerebral infarction due to unspecified occlusion or stenosis of unspecified cerebral artery: Secondary | ICD-10-CM

## 2014-06-05 LAB — POCT INR: INR: 1.5

## 2014-06-07 ENCOUNTER — Encounter: Payer: Self-pay | Admitting: Interventional Cardiology

## 2014-06-19 ENCOUNTER — Ambulatory Visit (INDEPENDENT_AMBULATORY_CARE_PROVIDER_SITE_OTHER): Payer: Commercial Managed Care - HMO | Admitting: Pharmacist

## 2014-06-19 DIAGNOSIS — I639 Cerebral infarction, unspecified: Secondary | ICD-10-CM

## 2014-06-19 DIAGNOSIS — Z5181 Encounter for therapeutic drug level monitoring: Secondary | ICD-10-CM

## 2014-06-19 DIAGNOSIS — I6350 Cerebral infarction due to unspecified occlusion or stenosis of unspecified cerebral artery: Secondary | ICD-10-CM

## 2014-06-19 DIAGNOSIS — I4891 Unspecified atrial fibrillation: Secondary | ICD-10-CM

## 2014-06-19 LAB — POCT INR: INR: 2.2

## 2014-07-25 ENCOUNTER — Other Ambulatory Visit: Payer: Self-pay | Admitting: Oncology

## 2014-07-27 ENCOUNTER — Ambulatory Visit (INDEPENDENT_AMBULATORY_CARE_PROVIDER_SITE_OTHER): Payer: PPO | Admitting: *Deleted

## 2014-07-27 DIAGNOSIS — Z5181 Encounter for therapeutic drug level monitoring: Secondary | ICD-10-CM

## 2014-07-27 DIAGNOSIS — I4891 Unspecified atrial fibrillation: Secondary | ICD-10-CM

## 2014-07-27 DIAGNOSIS — I6350 Cerebral infarction due to unspecified occlusion or stenosis of unspecified cerebral artery: Secondary | ICD-10-CM

## 2014-07-27 DIAGNOSIS — I639 Cerebral infarction, unspecified: Secondary | ICD-10-CM

## 2014-07-27 LAB — POCT INR: INR: 1.9

## 2014-08-11 ENCOUNTER — Other Ambulatory Visit: Payer: Self-pay | Admitting: Interventional Cardiology

## 2014-08-31 ENCOUNTER — Ambulatory Visit (INDEPENDENT_AMBULATORY_CARE_PROVIDER_SITE_OTHER): Payer: PPO | Admitting: *Deleted

## 2014-08-31 DIAGNOSIS — I639 Cerebral infarction, unspecified: Secondary | ICD-10-CM

## 2014-08-31 DIAGNOSIS — I4891 Unspecified atrial fibrillation: Secondary | ICD-10-CM

## 2014-08-31 DIAGNOSIS — Z5181 Encounter for therapeutic drug level monitoring: Secondary | ICD-10-CM

## 2014-08-31 DIAGNOSIS — I6350 Cerebral infarction due to unspecified occlusion or stenosis of unspecified cerebral artery: Secondary | ICD-10-CM

## 2014-08-31 LAB — POCT INR: INR: 2.2

## 2014-10-12 ENCOUNTER — Telehealth: Payer: Self-pay | Admitting: Oncology

## 2014-10-12 ENCOUNTER — Other Ambulatory Visit (HOSPITAL_BASED_OUTPATIENT_CLINIC_OR_DEPARTMENT_OTHER): Payer: PPO

## 2014-10-12 ENCOUNTER — Ambulatory Visit (HOSPITAL_BASED_OUTPATIENT_CLINIC_OR_DEPARTMENT_OTHER): Payer: PPO | Admitting: Oncology

## 2014-10-12 ENCOUNTER — Ambulatory Visit (INDEPENDENT_AMBULATORY_CARE_PROVIDER_SITE_OTHER): Payer: PPO | Admitting: Pharmacist

## 2014-10-12 VITALS — BP 136/72 | HR 77 | Temp 97.6°F | Resp 18 | Ht 62.0 in | Wt 154.0 lb

## 2014-10-12 DIAGNOSIS — Z5181 Encounter for therapeutic drug level monitoring: Secondary | ICD-10-CM | POA: Diagnosis not present

## 2014-10-12 DIAGNOSIS — C50411 Malignant neoplasm of upper-outer quadrant of right female breast: Secondary | ICD-10-CM | POA: Diagnosis not present

## 2014-10-12 DIAGNOSIS — Z170 Estrogen receptor positive status [ER+]: Secondary | ICD-10-CM | POA: Diagnosis not present

## 2014-10-12 DIAGNOSIS — I4891 Unspecified atrial fibrillation: Secondary | ICD-10-CM | POA: Diagnosis not present

## 2014-10-12 DIAGNOSIS — I639 Cerebral infarction, unspecified: Secondary | ICD-10-CM | POA: Diagnosis not present

## 2014-10-12 DIAGNOSIS — C50912 Malignant neoplasm of unspecified site of left female breast: Secondary | ICD-10-CM

## 2014-10-12 DIAGNOSIS — Z853 Personal history of malignant neoplasm of breast: Secondary | ICD-10-CM

## 2014-10-12 DIAGNOSIS — M8580 Other specified disorders of bone density and structure, unspecified site: Secondary | ICD-10-CM

## 2014-10-12 DIAGNOSIS — I6350 Cerebral infarction due to unspecified occlusion or stenosis of unspecified cerebral artery: Secondary | ICD-10-CM

## 2014-10-12 LAB — COMPREHENSIVE METABOLIC PANEL (CC13)
ALK PHOS: 55 U/L (ref 40–150)
ALT: 19 U/L (ref 0–55)
AST: 27 U/L (ref 5–34)
Albumin: 3.9 g/dL (ref 3.5–5.0)
Anion Gap: 7 mEq/L (ref 3–11)
BUN: 13.1 mg/dL (ref 7.0–26.0)
CO2: 30 mEq/L — ABNORMAL HIGH (ref 22–29)
CREATININE: 0.9 mg/dL (ref 0.6–1.1)
Calcium: 9.1 mg/dL (ref 8.4–10.4)
Chloride: 104 mEq/L (ref 98–109)
EGFR: 59 mL/min/{1.73_m2} — ABNORMAL LOW (ref >90)
Glucose: 109 mg/dl (ref 70–140)
POTASSIUM: 3.9 meq/L (ref 3.5–5.1)
Sodium: 141 mEq/L (ref 136–145)
Total Bilirubin: 0.89 mg/dL (ref 0.20–1.20)
Total Protein: 6.7 g/dL (ref 6.4–8.3)

## 2014-10-12 LAB — CBC WITH DIFFERENTIAL/PLATELET
BASO%: 0.8 % (ref 0.0–2.0)
Basophils Absolute: 0 10*3/uL (ref 0.0–0.1)
EOS ABS: 0.1 10*3/uL (ref 0.0–0.5)
EOS%: 2 % (ref 0.0–7.0)
HCT: 46.6 % (ref 34.8–46.6)
HGB: 15.4 g/dL (ref 11.6–15.9)
LYMPH%: 26.1 % (ref 14.0–49.7)
MCH: 28.9 pg (ref 25.1–34.0)
MCHC: 33 g/dL (ref 31.5–36.0)
MCV: 87.8 fL (ref 79.5–101.0)
MONO#: 0.5 10*3/uL (ref 0.1–0.9)
MONO%: 9.4 % (ref 0.0–14.0)
NEUT%: 61.7 % (ref 38.4–76.8)
NEUTROS ABS: 3.3 10*3/uL (ref 1.5–6.5)
Platelets: 199 10*3/uL (ref 145–400)
RBC: 5.31 10*6/uL (ref 3.70–5.45)
RDW: 14.1 % (ref 11.2–14.5)
WBC: 5.3 10*3/uL (ref 3.9–10.3)
lymph#: 1.4 10*3/uL (ref 0.9–3.3)

## 2014-10-12 LAB — POCT INR: INR: 2

## 2014-10-12 MED ORDER — LEVOTHYROXINE SODIUM 50 MCG PO TABS: 50.0000 ug | ORAL_TABLET | Freq: Every day | ORAL | Status: DC

## 2014-10-12 NOTE — Progress Notes (Signed)
ID: [name]   DOB: Nov 11, 1929  MR#: [id number]  CWC#:[id number]  PCP: Mathews Argyle, MD GYN: SU:  OTHER MD:   HISTORY OF PRESENT ILLNESS: From the original intake note:  [name] had screening mammography 06/11/2010 which showed an area of concern in the right breast.  The patient was recalled for additional views on December 15th.  Dr. Marcelo Baldy was able to demonstrate a persistent density in the upper outer quadrant of the right breast with spiculation.  Ultrasonography showed this to measure 1.7 cm and to be sufficiency suspicious to warrant biopsy which was performed on December 20th.  The pathology from this procedure ([id number]) showed an invasive carcinoma which may be lobular or may be ductal with lobular features.  It was ER 83% and PR 85% positive with a very low proliferation marker at 9%.  There was no evidence of Her-2 amplification with a ratio of 1.33.    Bilateral breast MRIs were obtained December 29th, 2011.  This showed in the upper central right breast an irregular mass like area of enhancement measuring maximally 1.9 cm.  This was associated with a hematoma but there was no evidence of multifocality or multicentricity.  No suspicious axillary or internal mammary lymph nodes and nothing seen in the left breast.  The patient had definitive Right lumpectomy February 2012, with results as detailed below.  INTERVAL HISTORY: [name] returns today for followup of her breast cancer. The interval history is very stable. There are still doing some travel, although not as much as before. She walks daily for about a half an hour. She does her housework. She continues on letrozole, with no significant problems in terms of vaginal dryness, hot flashes, or arthralgias/ myalgias   REVIEW OF SYSTEMS:  She continues to have significant back pain. This is limiting her to some extent. It is worse at night. She is thinking of seen Dr. ramus for some epidural treatments. Otherwise she has  stress urinary incontinence and a little bit forgetfulness as her main complaints. A detailed review of systems today was otherwise unremarkable  PAST MEDICAL HISTORY: Past Medical History  Diagnosis Date  . Stroke 03/18/03  . Breast lump   . Bruises easily   . Thyroid disease   . Hypertension   . Heart disease   . Incontinence   . Wears glasses   . Atrial fibrillation   . Hypercholesterolemia   . Osteopenia   . Cancer   . Breast cancer 06/19/10 biopsy     right, inv mammary, ER/PR +, hER2 -  . Breast CA 08/06/10    R lumpectomy  . Hx of radiation therapy 09/04/10 to 10/02/10    R breast  . Arrhythmia     PAF  . Dyslipidemia   . Hypothyroidism   Significant for atrial fibrillation which has been present for about 7 years.  The patient is on chronic coumadinization for this.  She tells me she had a stroke 7 years ago when the atrial fibrillation first developed.  She has no motor residuals for this but she does have not as good a sense of balance as before, she tells me; although there have been no recent falls.  She has hypercholesterolemia, hypothyroidism, hypertension.  She is status post bilateral cataract surgery, status post bilateral knee replacement, history of tonsillectomy and adenoidectomy.  History of appendectomy.  History of hysterectomy with unilateral salpingo-oophorectomy and history of osteopenia.    PAST SURGICAL HISTORY: Past Surgical History  Procedure Laterality Date  .  Appendectomy    . Ovarian cyst surgery    . Replacement total knee bilateral  08/12/10  . Breast lumpectomy  06/2008  . Breast lumpectomy  08/06/2010    R, INV LOBULAR, DCIS, ER/PR +, HER2-  . Cataract extraction, bilateral    . Tonsillectomy and adenoidectomy    . Abdominal hysterectomy      unilat bso    FAMILY HISTORY Family History  Problem Relation Age of Onset  . Cancer Brother     esophagus  . Cancer Mother     breast  The patient's father died from a myocardial infarction at the age  of 4.  The patient's mother died from breast cancer at the age of 76.  The patient has one sister alive at age 73 and a brother who died from esophageal cancer at age 39.  There are no other family members with breast or ovarian cancer to her knowledge.    GYNECOLOGIC HISTORY: She is GX P2.  Menarche age 81.  First pregnancy to term at age 31.  Hysterectomy in 1962.  She never took hormone replacement therapy.    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 Her husband of 29 years, Shanon Brow, was an Chief Financial Officer. Her two sons live in [place] and [place]. The [place] son works in business. The one in [place] is in government. She has 4 grandchildren. She is not a Ambulance person.   ADVANCED DIRECTIVES: In place  HEALTH MAINTENANCE: History  Substance Use Topics  . Smoking status: Never Smoker   . Smokeless tobacco: Not on file  . Alcohol Use: No     Colonoscopy: due  PAP: "not any more"  Bone density: 2009 at [facility], "osteopenia"  Lipid panel: "controlled"  No Known Allergies  Current Outpatient Prescriptions  Medication Sig Dispense Refill  . acetaminophen (TYLENOL) 325 MG tablet Take 650 mg by mouth 2 (two) times a week. As needed for pain    . alendronate (FOSAMAX) 70 MG tablet TAKE 1 TABLET BY MOUTH WEEKLY AS DIRECTED 12 tablet PRN  . amLODipine (NORVASC) 5 MG tablet Take 5 mg by mouth daily.      . calcium-vitamin D (OSCAL WITH D) 500-200 MG-UNIT per tablet Take 1 tablet by mouth daily.      . hydrochlorothiazide (HYDRODIURIL) 25 MG tablet Take 25 mg by mouth daily.      Marland Kitchen JANTOVEN 5 MG tablet TAKE AS DIRECTED BY COUMADIN CLINIC 40 tablet 3  . letrozole (FEMARA) 2.5 MG tablet Take 1 tablet (2.5 mg total) by mouth daily. 90 tablet 3  . levothyroxine (SYNTHROID, LEVOTHROID) 50 MCG tablet Take 50 mcg by mouth daily.      . Multiple Vitamin (MULTIVITAMIN WITH MINERALS) TABS Take 1 tablet by mouth daily.    . Potassium Chloride  CR (MICRO-K) 8 MEQ CPCR capsule CR     . pravastatin (PRAVACHOL) 10 MG tablet Take 10 mg by mouth daily.      . traMADol (ULTRAM) 50 MG tablet      No current facility-administered medications for this visit.    OBJECTIVE: Elderly white woman who appears stated age 26 Vitals:   10/12/14 1316  BP: 136/72  Pulse: 77  Temp: 97.6 F (36.4 C)  Resp: 18     Body mass index is 28.16 kg/(m^2).    ECOG FS: 2  Sclerae unicteric, pupils round and equal Oropharynx clear, dentition in good repair No cervical or supraclavicular lymphadenopathy Lungs  no rales or rhonchi Heart regular rate and rhythm Abd soft, nontender, positive bowel sounds MSK kyphosis and scoliosis, but no focal tenderness to palpation  Neuro: nonfocal, well oriented, appropriate affect Breasts: The right breast is status post lumpectomy. There is no evidence of chest wall recurrence. The right axilla is benign. The left breast is unremarkable  LAB RESULTS: Lab Results  Component Value Date   WBC 5.3 10/12/2014   NEUTROABS 3.3 10/12/2014   HGB 15.4 10/12/2014   HCT 46.6 10/12/2014   MCV 87.8 10/12/2014   PLT 199 10/12/2014      Chemistry      Component Value Date/Time   NA 142 09/08/2012 1255   NA 142 12/12/2011 0651   K 3.6 09/08/2012 1255   K 3.0* 12/12/2011 0651   CL 102 09/08/2012 1255   CL 101 12/12/2011 0651   CO2 30* 09/08/2012 1255   CO2 30 12/12/2011 0651   BUN 13.8 09/08/2012 1255   BUN 12 12/12/2011 0651   CREATININE 0.9 09/08/2012 1255   CREATININE 0.79 12/12/2011 0651      Component Value Date/Time   CALCIUM 9.5 09/08/2012 1255   CALCIUM 9.5 12/12/2011 0651   ALKPHOS 48 09/08/2012 1255   ALKPHOS 63 11/04/2011 1347   AST 28 09/08/2012 1255   AST 25 11/04/2011 1347   ALT 19 09/08/2012 1255   ALT 19 11/04/2011 1347   BILITOT 0.81 09/08/2012 1255   BILITOT 0.5 11/04/2011 1347       Lab Results  Component Value Date   LABCA2 30 06/09/2011    No components found for:  ONGEX528  No results for input(s): INR in the last 168 hours.  Urinalysis    Component Value Date/Time   COLORURINE YELLOW 08/05/2010 1358   APPEARANCEUR CLEAR 08/05/2010 1358   LABSPEC 1.016 08/05/2010 1358   PHURINE 7.0 08/05/2010 1358   HGBUR NEGATIVE 08/05/2010 [place] 08/05/2010 1358   KETONESUR NEGATIVE 08/05/2010 1358   PROTEINUR NEGATIVE 08/05/2010 1358   UROBILINOGEN 1.0 08/05/2010 1358   NITRITE NEGATIVE 08/05/2010 1358   LEUKOCYTESUR  08/05/2010 1358    NEGATIVE MICROSCOPIC NOT DONE ON URINES WITH NEGATIVE PROTEIN, BLOOD, LEUKOCYTES, NITRITE, OR GLUCOSE <1000 mg/dL.       STUDIES: DEXA scan 07/19/2012 at [facility] showed osteopenia, with a T score at the left femoral neck of -1.8. Repeat DEXA scan on 08/10/2014 showed a T score of -2.0  Bilateral diagnostic mammography at [facility] 07/20/2014 showed no evidence of malignancy.  ASSESSMENT:  81 y.o.  [place] woman status post right lumpectomy February 2012 for a T1c NX, Stage I invasive lobular carcinoma, strongly ER PR positive, HER2-neu negative with MIB-1 of 9%.  Status post radiation therapy, completed 10/02/2010, after which she began on letrozole 2.5 mg daily.  (1) osteopenia, with T score of -1.8 on DEXA scan 07/19/2012, with further decrease January 2016 (-2.0  PLAN: [place] is now 4 years out from her original diagnosis with no evidence of disease recurrence. She has one more year to go out to complete her treatments, at which point she will "graduate" from follow-up here.  She has significant back pain. She is considering going to Dr. Dossie Der. I think that would be a good move for her since this is limiting her activities. It is not going to get better on its oh  Her bone density is little bit worse. She is already on alendronate and she takes it faithfully. I am leaving decisions regarding intensification of her  treatment for osteopenia to Dr. Felipa Eth. Likely I would mean Reclast  Otherwise  Ruthanna will see me in one year and I have asked her to wear paying for her graduation.  MAGRINAT,GUSTAV C    10/12/2014

## 2014-10-12 NOTE — Telephone Encounter (Signed)
Confirmed appointment for April 2017. Mailed calendar.

## 2014-10-20 ENCOUNTER — Other Ambulatory Visit: Payer: Self-pay | Admitting: Oncology

## 2014-11-22 ENCOUNTER — Ambulatory Visit (INDEPENDENT_AMBULATORY_CARE_PROVIDER_SITE_OTHER): Payer: PPO

## 2014-11-22 DIAGNOSIS — Z5181 Encounter for therapeutic drug level monitoring: Secondary | ICD-10-CM

## 2014-11-22 DIAGNOSIS — I4891 Unspecified atrial fibrillation: Secondary | ICD-10-CM | POA: Diagnosis not present

## 2014-11-22 DIAGNOSIS — I6350 Cerebral infarction due to unspecified occlusion or stenosis of unspecified cerebral artery: Secondary | ICD-10-CM

## 2014-11-22 DIAGNOSIS — I639 Cerebral infarction, unspecified: Secondary | ICD-10-CM

## 2014-11-22 LAB — POCT INR: INR: 3.6

## 2014-12-14 ENCOUNTER — Ambulatory Visit (INDEPENDENT_AMBULATORY_CARE_PROVIDER_SITE_OTHER): Payer: PPO | Admitting: *Deleted

## 2014-12-14 DIAGNOSIS — Z5181 Encounter for therapeutic drug level monitoring: Secondary | ICD-10-CM

## 2014-12-14 DIAGNOSIS — I639 Cerebral infarction, unspecified: Secondary | ICD-10-CM | POA: Diagnosis not present

## 2014-12-14 DIAGNOSIS — I6350 Cerebral infarction due to unspecified occlusion or stenosis of unspecified cerebral artery: Secondary | ICD-10-CM

## 2014-12-14 DIAGNOSIS — I4891 Unspecified atrial fibrillation: Secondary | ICD-10-CM | POA: Diagnosis not present

## 2014-12-14 LAB — POCT INR: INR: 2

## 2015-01-12 ENCOUNTER — Ambulatory Visit (INDEPENDENT_AMBULATORY_CARE_PROVIDER_SITE_OTHER): Payer: PPO

## 2015-01-12 DIAGNOSIS — I639 Cerebral infarction, unspecified: Secondary | ICD-10-CM

## 2015-01-12 DIAGNOSIS — Z5181 Encounter for therapeutic drug level monitoring: Secondary | ICD-10-CM

## 2015-01-12 DIAGNOSIS — I4891 Unspecified atrial fibrillation: Secondary | ICD-10-CM | POA: Diagnosis not present

## 2015-01-12 DIAGNOSIS — I6350 Cerebral infarction due to unspecified occlusion or stenosis of unspecified cerebral artery: Secondary | ICD-10-CM

## 2015-01-12 LAB — POCT INR: INR: 1.5

## 2015-01-26 ENCOUNTER — Ambulatory Visit (INDEPENDENT_AMBULATORY_CARE_PROVIDER_SITE_OTHER): Payer: PPO | Admitting: Pharmacist

## 2015-01-26 DIAGNOSIS — I4891 Unspecified atrial fibrillation: Secondary | ICD-10-CM | POA: Diagnosis not present

## 2015-01-26 DIAGNOSIS — I6350 Cerebral infarction due to unspecified occlusion or stenosis of unspecified cerebral artery: Secondary | ICD-10-CM

## 2015-01-26 DIAGNOSIS — Z5181 Encounter for therapeutic drug level monitoring: Secondary | ICD-10-CM

## 2015-01-26 DIAGNOSIS — I639 Cerebral infarction, unspecified: Secondary | ICD-10-CM | POA: Diagnosis not present

## 2015-01-26 LAB — POCT INR: INR: 2

## 2015-02-01 ENCOUNTER — Other Ambulatory Visit: Payer: Self-pay | Admitting: Interventional Cardiology

## 2015-02-15 ENCOUNTER — Ambulatory Visit (INDEPENDENT_AMBULATORY_CARE_PROVIDER_SITE_OTHER): Payer: PPO | Admitting: *Deleted

## 2015-02-15 DIAGNOSIS — I6350 Cerebral infarction due to unspecified occlusion or stenosis of unspecified cerebral artery: Secondary | ICD-10-CM

## 2015-02-15 DIAGNOSIS — I4891 Unspecified atrial fibrillation: Secondary | ICD-10-CM | POA: Diagnosis not present

## 2015-02-15 DIAGNOSIS — I639 Cerebral infarction, unspecified: Secondary | ICD-10-CM | POA: Diagnosis not present

## 2015-02-15 DIAGNOSIS — Z5181 Encounter for therapeutic drug level monitoring: Secondary | ICD-10-CM | POA: Diagnosis not present

## 2015-02-15 LAB — POCT INR: INR: 1.9

## 2015-02-21 ENCOUNTER — Encounter: Payer: Self-pay | Admitting: Interventional Cardiology

## 2015-03-08 ENCOUNTER — Ambulatory Visit (INDEPENDENT_AMBULATORY_CARE_PROVIDER_SITE_OTHER): Payer: PPO | Admitting: *Deleted

## 2015-03-08 DIAGNOSIS — Z5181 Encounter for therapeutic drug level monitoring: Secondary | ICD-10-CM | POA: Diagnosis not present

## 2015-03-08 DIAGNOSIS — I4891 Unspecified atrial fibrillation: Secondary | ICD-10-CM | POA: Diagnosis not present

## 2015-03-08 DIAGNOSIS — I639 Cerebral infarction, unspecified: Secondary | ICD-10-CM

## 2015-03-08 DIAGNOSIS — I6350 Cerebral infarction due to unspecified occlusion or stenosis of unspecified cerebral artery: Secondary | ICD-10-CM

## 2015-03-08 LAB — POCT INR: INR: 2.1

## 2015-04-04 ENCOUNTER — Other Ambulatory Visit: Payer: Self-pay | Admitting: Interventional Cardiology

## 2015-04-09 ENCOUNTER — Ambulatory Visit (INDEPENDENT_AMBULATORY_CARE_PROVIDER_SITE_OTHER): Payer: PPO | Admitting: *Deleted

## 2015-04-09 DIAGNOSIS — Z5181 Encounter for therapeutic drug level monitoring: Secondary | ICD-10-CM

## 2015-04-09 DIAGNOSIS — I6350 Cerebral infarction due to unspecified occlusion or stenosis of unspecified cerebral artery: Secondary | ICD-10-CM

## 2015-04-09 DIAGNOSIS — I4891 Unspecified atrial fibrillation: Secondary | ICD-10-CM

## 2015-04-09 LAB — POCT INR: INR: 2.2

## 2015-04-22 ENCOUNTER — Other Ambulatory Visit: Payer: Self-pay | Admitting: Oncology

## 2015-05-08 ENCOUNTER — Other Ambulatory Visit: Payer: Self-pay | Admitting: Interventional Cardiology

## 2015-05-17 ENCOUNTER — Ambulatory Visit (INDEPENDENT_AMBULATORY_CARE_PROVIDER_SITE_OTHER): Payer: PPO

## 2015-05-17 DIAGNOSIS — I4891 Unspecified atrial fibrillation: Secondary | ICD-10-CM | POA: Diagnosis not present

## 2015-05-17 DIAGNOSIS — Z5181 Encounter for therapeutic drug level monitoring: Secondary | ICD-10-CM

## 2015-05-17 DIAGNOSIS — I6350 Cerebral infarction due to unspecified occlusion or stenosis of unspecified cerebral artery: Secondary | ICD-10-CM | POA: Diagnosis not present

## 2015-05-17 LAB — POCT INR: INR: 4

## 2015-06-05 ENCOUNTER — Ambulatory Visit (INDEPENDENT_AMBULATORY_CARE_PROVIDER_SITE_OTHER): Payer: PPO | Admitting: *Deleted

## 2015-06-05 DIAGNOSIS — I4891 Unspecified atrial fibrillation: Secondary | ICD-10-CM

## 2015-06-05 DIAGNOSIS — I6350 Cerebral infarction due to unspecified occlusion or stenosis of unspecified cerebral artery: Secondary | ICD-10-CM

## 2015-06-05 DIAGNOSIS — Z5181 Encounter for therapeutic drug level monitoring: Secondary | ICD-10-CM

## 2015-06-05 LAB — POCT INR: INR: 1.9

## 2015-06-22 ENCOUNTER — Ambulatory Visit (INDEPENDENT_AMBULATORY_CARE_PROVIDER_SITE_OTHER): Payer: PPO | Admitting: Surgery

## 2015-06-22 DIAGNOSIS — I6350 Cerebral infarction due to unspecified occlusion or stenosis of unspecified cerebral artery: Secondary | ICD-10-CM

## 2015-06-22 DIAGNOSIS — I4891 Unspecified atrial fibrillation: Secondary | ICD-10-CM

## 2015-06-22 DIAGNOSIS — Z5181 Encounter for therapeutic drug level monitoring: Secondary | ICD-10-CM | POA: Diagnosis not present

## 2015-06-22 LAB — POCT INR: INR: 2.5

## 2015-07-09 ENCOUNTER — Ambulatory Visit: Payer: PPO | Admitting: Interventional Cardiology

## 2015-07-19 ENCOUNTER — Ambulatory Visit (INDEPENDENT_AMBULATORY_CARE_PROVIDER_SITE_OTHER): Payer: PPO | Admitting: *Deleted

## 2015-07-19 DIAGNOSIS — I6350 Cerebral infarction due to unspecified occlusion or stenosis of unspecified cerebral artery: Secondary | ICD-10-CM

## 2015-07-19 DIAGNOSIS — I4891 Unspecified atrial fibrillation: Secondary | ICD-10-CM

## 2015-07-19 DIAGNOSIS — Z5181 Encounter for therapeutic drug level monitoring: Secondary | ICD-10-CM | POA: Diagnosis not present

## 2015-07-19 LAB — POCT INR: INR: 2.4

## 2015-07-26 ENCOUNTER — Ambulatory Visit (INDEPENDENT_AMBULATORY_CARE_PROVIDER_SITE_OTHER): Payer: PPO | Admitting: *Deleted

## 2015-07-26 ENCOUNTER — Ambulatory Visit (INDEPENDENT_AMBULATORY_CARE_PROVIDER_SITE_OTHER): Payer: PPO | Admitting: Interventional Cardiology

## 2015-07-26 ENCOUNTER — Encounter: Payer: Self-pay | Admitting: Interventional Cardiology

## 2015-07-26 VITALS — BP 160/70 | HR 82 | Ht 62.0 in | Wt 154.0 lb

## 2015-07-26 DIAGNOSIS — Z0000 Encounter for general adult medical examination without abnormal findings: Secondary | ICD-10-CM

## 2015-07-26 DIAGNOSIS — I4891 Unspecified atrial fibrillation: Secondary | ICD-10-CM | POA: Diagnosis not present

## 2015-07-26 DIAGNOSIS — I10 Essential (primary) hypertension: Secondary | ICD-10-CM | POA: Diagnosis not present

## 2015-07-26 DIAGNOSIS — G459 Transient cerebral ischemic attack, unspecified: Secondary | ICD-10-CM | POA: Insufficient documentation

## 2015-07-26 DIAGNOSIS — Z5181 Encounter for therapeutic drug level monitoring: Secondary | ICD-10-CM

## 2015-07-26 DIAGNOSIS — I6350 Cerebral infarction due to unspecified occlusion or stenosis of unspecified cerebral artery: Secondary | ICD-10-CM

## 2015-07-26 DIAGNOSIS — R5382 Chronic fatigue, unspecified: Secondary | ICD-10-CM

## 2015-07-26 DIAGNOSIS — R4781 Slurred speech: Secondary | ICD-10-CM | POA: Diagnosis not present

## 2015-07-26 DIAGNOSIS — G458 Other transient cerebral ischemic attacks and related syndromes: Secondary | ICD-10-CM

## 2015-07-26 DIAGNOSIS — I480 Paroxysmal atrial fibrillation: Secondary | ICD-10-CM

## 2015-07-26 DIAGNOSIS — R5383 Other fatigue: Secondary | ICD-10-CM | POA: Insufficient documentation

## 2015-07-26 LAB — POCT INR: INR: 2.4

## 2015-07-26 NOTE — Progress Notes (Signed)
Patient ID: [name], female   DOB: [birth year], 82 y.o.   MRN: [id number]     Cardiology Office Note   Date:  07/26/2015   ID:  [name], DOB 1929-10-01, MRN [id number]  PCP:  Patricia Argyle, MD    No chief complaint on file. f/u AFib   Wt Readings from Last 3 Encounters:  07/26/15 154 lb (69.854 kg)  10/12/14 154 lb (69.854 kg)  05/23/14 150 lb 12.8 oz (68.402 kg)       History of Present Illness: [name] is a 82 y.o. female  who has had AFib. She had a stroke several years ago. She has been maintained on Coumadin. No bleeding problems. Occasional bruising. No palpitations. No CP or SHOB.   Back pain is the most limiting thing for her. S/p compression fracture and had "cement insertion" procedure but still has residual pain.   Breast cancer diagnosed in 2011.   In the past year, she has had several short episodes of feeling of lightheadedness and a strange feeling.  It would last seconds to minutes.  No change in position at the time.    Earlier in the week, she had some lightheadedness but this was associated with slurred speech and imbalance.  It went away after 15 minutes, which is longer then the previous episodes.   Now, she feels back to normal.  She has been feeling tired. She has an appt with her PMD next month.  She walks 5 days /week, 1 mile, without any difficulty.  She goes up some hills as well.   Past Medical History  Diagnosis Date  . Stroke ([place]) 03/18/03  . Breast lump   . Bruises easily   . Thyroid disease   . Hypertension   . Heart disease   . Incontinence   . Wears glasses   . Atrial fibrillation ([place])   . Hypercholesterolemia   . Osteopenia   . Cancer ([place])   . Breast cancer ([place]) 06/19/10 biopsy     right, inv mammary, ER/PR +, hER2 -  . Breast CA ([place]) 08/06/10    R lumpectomy  . Hx of radiation therapy 09/04/10 to 10/02/10    R breast  . Arrhythmia     PAF  . Dyslipidemia   . Hypothyroidism     Past  Surgical History  Procedure Laterality Date  . Appendectomy    . Ovarian cyst surgery    . Replacement total knee bilateral  08/12/10  . Breast lumpectomy  06/2008  . Breast lumpectomy  08/06/2010    R, INV LOBULAR, DCIS, ER/PR +, HER2-  . Cataract extraction, bilateral    . Tonsillectomy and adenoidectomy    . Abdominal hysterectomy      unilat bso     Current Outpatient Prescriptions  Medication Sig Dispense Refill  . acetaminophen (TYLENOL) 325 MG tablet Take 650 mg by mouth 2 (two) times a week. As needed for pain    . alendronate (FOSAMAX) 70 MG tablet TAKE 1 TABLET BY MOUTH WEEKLY AS DIRECTED 12 tablet PRN  . amLODipine (NORVASC) 5 MG tablet Take 5 mg by mouth daily.      . calcium-vitamin D (OSCAL WITH D) 500-200 MG-UNIT per tablet Take 1 tablet by mouth daily.      . hydrochlorothiazide (HYDRODIURIL) 25 MG tablet Take 25 mg by mouth daily.      Marland Kitchen JANTOVEN 5 MG tablet TAKE AS DIRECTED BY COUMADIN CLINIC (Patient taking differently: PATIENT TAKING 1  TABLET 5 MG BY MOUTH ON TUE AND THURS THEN 1 1/2 TABLET MON, WED, FRI) 40 tablet 2  . letrozole (FEMARA) 2.5 MG tablet TAKE 1 TABLET (2.5 MG TOTAL) BY MOUTH DAILY. 90 tablet 2  . levothyroxine (SYNTHROID, LEVOTHROID) 50 MCG tablet Take 1 tablet (50 mcg total) by mouth daily. 90 tablet 4  . Multiple Vitamin (MULTIVITAMIN WITH MINERALS) TABS Take 1 tablet by mouth daily.    . Potassium Chloride CR (MICRO-K) 8 MEQ CPCR capsule CR Take 8 mEq by mouth 2 (two) times daily.     . pravastatin (PRAVACHOL) 10 MG tablet Take 10 mg by mouth daily.      . traMADol (ULTRAM) 50 MG tablet Take 50 mg by mouth as needed for moderate pain.      No current facility-administered medications for this visit.    Allergies:   Review of patient's allergies indicates no known allergies.    Social History:  The patient  reports that she has never smoked. She does not have any smokeless tobacco history on file. She reports that she does not drink alcohol or use  illicit drugs.   Family History:  The patient's family history includes Cancer in her brother and mother; Heart attack in her father.    ROS:  Please see the history of present illness.   Otherwise, review of systems are positive for neuro sx noted above.   All other systems are reviewed and negative.    PHYSICAL EXAM: VS:  BP 160/70 mmHg  Pulse 82  Ht '5\' 2"'  (1.575 m)  Wt 154 lb (69.854 kg)  BMI 28.16 kg/m2 , BMI Body mass index is 28.16 kg/(m^2). GEN: Well nourished, well developed, in no acute distress HEENT: normal Neck: no JVD, carotid bruits, or masses Cardiac: RRR; no murmurs, rubs, or gallops,no edema  Respiratory:  clear to auscultation bilaterally, normal work of breathing GI: soft, nontender, nondistended, + BS MS: no deformity or atrophy Skin: warm and dry, no rash Neuro:  Strength and sensation are intact, slower gait Psych: euthymic mood, full affect   EKG:   The ekg ordered today demonstrates NSR, no ST segment changes   Recent Labs: 10/12/2014: ALT 19; BUN 13.1; Creatinine 0.9; HGB 15.4; Platelets 199; Potassium 3.9; Sodium 141   Lipid Panel No results found for: CHOL, TRIG, HDL, CHOLHDL, VLDL, LDLCALC, LDLDIRECT   Other studies Reviewed: Additional studies/ records that were reviewed today with results demonstrating: no carotid Doppler in the past few years.   ASSESSMENT AND PLAN:  1. Atrial fibrillation: Paroxysmal. No symptoms of bradycardia, which she was concerned about in 2014. She is limited mostly by her back. Would continue current medications. She is not taking anything for rate slowing purposes.  No recent sustained palpitations. Rare flutters. 2. Hypertension: Well controlled.  3. TIA: Question of TIA sx with slurred speech transiently.  Will check carotid Doppler.  Counseled her to get to the ER immediately if she has these types of sx.  Check INR as well.  Coumadin for stroke prevention.  If she was subtherapeutic, this would put her at risk for  TIA. 4. Fatigue: f/u with Dr. Felipa Eth.  He follows her thyroid disease.   Current medicines are reviewed at length with the patient today.  The patient concerns regarding her medicines were addressed.  The following changes have been made:  No change  Labs/ tests ordered today include: Carotid Doppler No orders of the defined types were placed in this encounter.  Recommend 150 minutes/week of aerobic exercise Low fat, low carb, high fiber diet recommended  Disposition:   FU in 1 year   Teresita Madura., MD  07/26/2015 9:03 AM    [facility] [address], [place], [place]  [address] Phone: [phone]; Fax: [phone]

## 2015-07-26 NOTE — Patient Instructions (Signed)
Medication Instructions:  NO CHANGES  Labwork: NONE  Testing/Procedures: Your physician has requested that you have a carotid duplex. This test is an ultrasound of the carotid arteries in your neck. It looks at blood flow through these arteries that supply the brain with blood. Allow one hour for this exam. There are no restrictions or special instructions.   Follow-Up:  Your physician wants you to follow-up in: [place] will receive a reminder letter in the mail two months in advance. If you don't receive a letter, please call our office to schedule the follow-up appointment. Any Other Special Instructions Will Be Listed Below (If Applicable).     If you need a refill on your cardiac medications before your next appointment, please call your pharmacy.

## 2015-07-27 ENCOUNTER — Inpatient Hospital Stay (HOSPITAL_COMMUNITY): Admission: RE | Admit: 2015-07-27 | Payer: PPO | Source: Ambulatory Visit

## 2015-08-01 ENCOUNTER — Ambulatory Visit (HOSPITAL_COMMUNITY): Admission: RE | Admit: 2015-08-01 | Payer: PPO | Source: Ambulatory Visit

## 2015-08-10 ENCOUNTER — Ambulatory Visit (HOSPITAL_COMMUNITY)
Admission: RE | Admit: 2015-08-10 | Discharge: 2015-08-10 | Disposition: A | Discharge status: Home / Self Care | Payer: PPO | Source: Ambulatory Visit | Attending: Cardiovascular Disease | Admitting: Cardiovascular Disease

## 2015-08-10 DIAGNOSIS — I10 Essential (primary) hypertension: Secondary | ICD-10-CM | POA: Diagnosis not present

## 2015-08-10 DIAGNOSIS — I6523 Occlusion and stenosis of bilateral carotid arteries: Secondary | ICD-10-CM | POA: Insufficient documentation

## 2015-08-10 DIAGNOSIS — R42 Dizziness and giddiness: Secondary | ICD-10-CM | POA: Diagnosis not present

## 2015-08-10 DIAGNOSIS — Z8673 Personal history of transient ischemic attack (TIA), and cerebral infarction without residual deficits: Secondary | ICD-10-CM | POA: Diagnosis not present

## 2015-08-10 DIAGNOSIS — R4781 Slurred speech: Secondary | ICD-10-CM | POA: Diagnosis not present

## 2015-08-14 DIAGNOSIS — Z853 Personal history of malignant neoplasm of breast: Secondary | ICD-10-CM | POA: Diagnosis not present

## 2015-08-16 ENCOUNTER — Other Ambulatory Visit: Payer: Self-pay | Admitting: Interventional Cardiology

## 2015-08-23 ENCOUNTER — Ambulatory Visit (INDEPENDENT_AMBULATORY_CARE_PROVIDER_SITE_OTHER): Payer: PPO | Admitting: *Deleted

## 2015-08-23 DIAGNOSIS — I6350 Cerebral infarction due to unspecified occlusion or stenosis of unspecified cerebral artery: Secondary | ICD-10-CM

## 2015-08-23 DIAGNOSIS — Z5181 Encounter for therapeutic drug level monitoring: Secondary | ICD-10-CM | POA: Diagnosis not present

## 2015-08-23 DIAGNOSIS — I4891 Unspecified atrial fibrillation: Secondary | ICD-10-CM

## 2015-08-23 LAB — POCT INR: INR: 1.7

## 2015-09-06 ENCOUNTER — Ambulatory Visit (INDEPENDENT_AMBULATORY_CARE_PROVIDER_SITE_OTHER): Payer: PPO | Admitting: *Deleted

## 2015-09-06 DIAGNOSIS — I6350 Cerebral infarction due to unspecified occlusion or stenosis of unspecified cerebral artery: Secondary | ICD-10-CM | POA: Diagnosis not present

## 2015-09-06 DIAGNOSIS — I4891 Unspecified atrial fibrillation: Secondary | ICD-10-CM

## 2015-09-06 DIAGNOSIS — Z5181 Encounter for therapeutic drug level monitoring: Secondary | ICD-10-CM | POA: Diagnosis not present

## 2015-09-06 LAB — POCT INR: INR: 2

## 2015-09-18 ENCOUNTER — Encounter (HOSPITAL_BASED_OUTPATIENT_CLINIC_OR_DEPARTMENT_OTHER): Payer: Self-pay | Admitting: Emergency Medicine

## 2015-09-18 ENCOUNTER — Emergency Department (HOSPITAL_BASED_OUTPATIENT_CLINIC_OR_DEPARTMENT_OTHER)
Admission: EM | Admit: 2015-09-18 | Discharge: 2015-09-18 | Discharge status: Against Medical Advice | Payer: PPO | Attending: Physician Assistant | Admitting: Physician Assistant

## 2015-09-18 ENCOUNTER — Emergency Department (HOSPITAL_BASED_OUTPATIENT_CLINIC_OR_DEPARTMENT_OTHER): Payer: PPO

## 2015-09-18 DIAGNOSIS — S199XXA Unspecified injury of neck, initial encounter: Secondary | ICD-10-CM | POA: Diagnosis not present

## 2015-09-18 DIAGNOSIS — Z973 Presence of spectacles and contact lenses: Secondary | ICD-10-CM | POA: Diagnosis not present

## 2015-09-18 DIAGNOSIS — E079 Disorder of thyroid, unspecified: Secondary | ICD-10-CM | POA: Diagnosis not present

## 2015-09-18 DIAGNOSIS — Z8673 Personal history of transient ischemic attack (TIA), and cerebral infarction without residual deficits: Secondary | ICD-10-CM | POA: Diagnosis not present

## 2015-09-18 DIAGNOSIS — Z23 Encounter for immunization: Secondary | ICD-10-CM | POA: Insufficient documentation

## 2015-09-18 DIAGNOSIS — Z853 Personal history of malignant neoplasm of breast: Secondary | ICD-10-CM | POA: Insufficient documentation

## 2015-09-18 DIAGNOSIS — Y9301 Activity, walking, marching and hiking: Secondary | ICD-10-CM | POA: Diagnosis not present

## 2015-09-18 DIAGNOSIS — Z79899 Other long term (current) drug therapy: Secondary | ICD-10-CM | POA: Insufficient documentation

## 2015-09-18 DIAGNOSIS — E785 Hyperlipidemia, unspecified: Secondary | ICD-10-CM | POA: Diagnosis not present

## 2015-09-18 DIAGNOSIS — E7800 Pure hypercholesterolemia, unspecified: Secondary | ICD-10-CM | POA: Insufficient documentation

## 2015-09-18 DIAGNOSIS — Y998 Other external cause status: Secondary | ICD-10-CM | POA: Diagnosis not present

## 2015-09-18 DIAGNOSIS — S299XXA Unspecified injury of thorax, initial encounter: Secondary | ICD-10-CM | POA: Diagnosis not present

## 2015-09-18 DIAGNOSIS — S6991XA Unspecified injury of right wrist, hand and finger(s), initial encounter: Secondary | ICD-10-CM | POA: Diagnosis not present

## 2015-09-18 DIAGNOSIS — S0990XA Unspecified injury of head, initial encounter: Secondary | ICD-10-CM | POA: Diagnosis not present

## 2015-09-18 DIAGNOSIS — Y92009 Unspecified place in unspecified non-institutional (private) residence as the place of occurrence of the external cause: Secondary | ICD-10-CM | POA: Insufficient documentation

## 2015-09-18 DIAGNOSIS — R55 Syncope and collapse: Secondary | ICD-10-CM | POA: Diagnosis not present

## 2015-09-18 DIAGNOSIS — S0081XA Abrasion of other part of head, initial encounter: Secondary | ICD-10-CM | POA: Insufficient documentation

## 2015-09-18 DIAGNOSIS — W228XXA Striking against or struck by other objects, initial encounter: Secondary | ICD-10-CM | POA: Diagnosis not present

## 2015-09-18 DIAGNOSIS — Z87448 Personal history of other diseases of urinary system: Secondary | ICD-10-CM | POA: Diagnosis not present

## 2015-09-18 DIAGNOSIS — M25531 Pain in right wrist: Secondary | ICD-10-CM | POA: Diagnosis not present

## 2015-09-18 DIAGNOSIS — R079 Chest pain, unspecified: Secondary | ICD-10-CM | POA: Diagnosis not present

## 2015-09-18 DIAGNOSIS — I519 Heart disease, unspecified: Secondary | ICD-10-CM | POA: Insufficient documentation

## 2015-09-18 DIAGNOSIS — R51 Headache: Secondary | ICD-10-CM | POA: Diagnosis not present

## 2015-09-18 DIAGNOSIS — R220 Localized swelling, mass and lump, head: Secondary | ICD-10-CM | POA: Diagnosis not present

## 2015-09-18 MED ORDER — TETANUS-DIPHTH-ACELL PERTUSSIS 5-2.5-18.5 LF-MCG/0.5 IM SUSP
0.5000 mL | Freq: Once | INTRAMUSCULAR | Status: AC
  Administered 2015-09-18: 0.5 mL via INTRAMUSCULAR
  Filled 2015-09-18: qty 0.5

## 2015-09-18 NOTE — ED Notes (Signed)
c-collar placed on patient, pt sitting in triage 2 alert and oriented, bleeding controlled

## 2015-09-18 NOTE — ED Notes (Signed)
Pt reports that she was walking out of [employer], got confused about what door she was at and had a syncopal episode hitting left side of head,

## 2015-09-18 NOTE — ED Provider Notes (Signed)
CSN: [id number]     Arrival date & time 09/18/15  1730 History   By signing my name below, I, [name], attest that this documentation has been prepared under the direction and in the presence of [name] Julio Alm, MD. Electronically Signed: Julien [name], ED Scribe. 09/18/2015. 7:03 PM.     Chief Complaint  Patient presents with  . Head Injury      The history is provided by the patient and the spouse. No language interpreter was used.   HPI Comments: [name] is a 82 y.o. female who has a PMHx of CVA, thyroid disease, HTN, incontinence, a-fib, hypercholesterolemia, right breast cancer, arrythmia, dyslipidemia, and hypothyroidism presents to the Emergency Department presenting with a moderate, gradual worsening, moderate, head injury with one episode of LOC this afternoon. Pt report she was leaving [employer] and she had a syncopal episode, fell and hit her head on concrete. She has a small skin tear to the left side of her forehead. Pt notes she does not remember what happened to her and states that it just hit her suddenly. She is currently taking coumadin and states she gets mildly light-headed occasionally. Denies chest pain, SOB, and neck pain.   Cardiologists: Dr. Irish Lack  Past Medical History  Diagnosis Date  . Stroke ([place]) 03/18/03  . Breast lump   . Bruises easily   . Thyroid disease   . Hypertension   . Heart disease   . Incontinence   . Wears glasses   . Atrial fibrillation ([place])   . Hypercholesterolemia   . Osteopenia   . Cancer ([place])   . Breast cancer ([place]) 06/19/10 biopsy     right, inv mammary, ER/PR +, hER2 -  . Breast CA ([place]) 08/06/10    R lumpectomy  . Hx of radiation therapy 09/04/10 to 10/02/10    R breast  . Arrhythmia     PAF  . Dyslipidemia   . Hypothyroidism    Past Surgical History  Procedure Laterality Date  . Appendectomy    . Ovarian cyst surgery    . Replacement total knee bilateral  08/12/10  . Breast lumpectomy  06/2008  .  Breast lumpectomy  08/06/2010    R, INV LOBULAR, DCIS, ER/PR +, HER2-  . Cataract extraction, bilateral    . Tonsillectomy and adenoidectomy    . Abdominal hysterectomy      unilat bso   Family History  Problem Relation Age of Onset  . Cancer Brother     esophagus  . Cancer Mother     breast  . Heart attack Father    Social History  Substance Use Topics  . Smoking status: Never Smoker   . Smokeless tobacco: None  . Alcohol Use: No   OB History    No data available     Review of Systems  Respiratory: Negative for shortness of breath.   Cardiovascular: Negative for chest pain.  Musculoskeletal: Negative for neck pain.  Neurological: Positive for headaches.  All other systems reviewed and are negative.     Allergies  Review of patient's allergies indicates no known allergies.  Home Medications   Prior to Admission medications   Medication Sig Start Date End Date Taking? Authorizing Provider  acetaminophen (TYLENOL) 325 MG tablet Take 650 mg by mouth 2 (two) times a week. As needed for pain   Yes Historical Provider, MD  alendronate (FOSAMAX) 70 MG tablet TAKE 1 TABLET BY MOUTH WEEKLY AS DIRECTED 09/15/12  Yes Sarajane Jews  C Magrinat, MD  amLODipine (NORVASC) 5 MG tablet Take 5 mg by mouth daily.     Yes Historical Provider, MD  calcium-vitamin D (OSCAL WITH D) 500-200 MG-UNIT per tablet Take 1 tablet by mouth daily.     Yes Historical Provider, MD  hydrochlorothiazide (HYDRODIURIL) 25 MG tablet Take 25 mg by mouth daily.     Yes Historical Provider, MD  JANTOVEN 5 MG tablet TAKE AS DIRECTED BY COUMADIN CLINIC 08/17/15  Yes [name], MD  letrozole [facility]) 2.5 MG tablet TAKE 1 TABLET (2.5 MG TOTAL) BY MOUTH DAILY. 10/20/14  Yes Chauncey Cruel, MD  levothyroxine (SYNTHROID, LEVOTHROID) 50 MCG tablet Take 1 tablet (50 mcg total) by mouth daily. 10/12/14  Yes Chauncey Cruel, MD  Multiple Vitamin (MULTIVITAMIN WITH MINERALS) TABS Take 1 tablet by mouth daily.   Yes  Historical Provider, MD  Potassium Chloride CR (MICRO-K) 8 MEQ CPCR capsule CR Take 8 mEq by mouth 2 (two) times daily.  10/01/13  Yes Historical Provider, MD  pravastatin (PRAVACHOL) 10 MG tablet Take 10 mg by mouth daily.     Yes Historical Provider, MD  traMADol (ULTRAM) 50 MG tablet Take 50 mg by mouth as needed for moderate pain.  04/12/14  Yes Historical Provider, MD   Triage vitals: BP 143/70 mmHg  Pulse 89  Resp 16  SpO2 98% Physical Exam  Constitutional: She is oriented to person, place, and time. She appears well-developed and well-nourished.  HENT:  Head: Normocephalic and atraumatic.  Mouth/Throat: Oropharynx is clear and moist.  Eyes: Conjunctivae and EOM are normal. Pupils are equal, round, and reactive to light.  Neck: Normal range of motion.  No real pain with movement of neck, pt able to turn head with no problem  Cardiovascular: Normal rate, regular rhythm and normal heart sounds.   Pulmonary/Chest: Effort normal and breath sounds normal.  Abdominal: Soft. Bowel sounds are normal.  Musculoskeletal: Normal range of motion.  Abrasion to left forehead  Neurological: She is alert and oriented to person, place, and time. No cranial nerve deficit.  Cranial nerves intact  Skin: Skin is warm and dry.  Psychiatric: She has a normal mood and affect.  Nursing note and vitals reviewed.   ED Course  Procedures  DIAGNOSTIC STUDIES: Oxygen Saturation is 98% on RA, normal by my interpretation.  COORDINATION OF CARE:  7:00 PM Discussed treatment plan which includes follow up with cardiologist first thing in the morning and EKG with pt at bedside and pt agreed to plan.  6:58 PM Dr. Thomasene Lot expressed the risks of leaving without being admitted and evaluated for a further workup but pt declined and stated she understood the risks.   Labs Review Labs Reviewed - No data to display  Imaging Review Dg Chest 2 View  09/18/2015  CLINICAL DATA:  Status post fall landing on the chest.  Pain. Initial encounter. EXAM: CHEST  2 VIEW COMPARISON:  CT chest 12/15/2013.  PA and lateral chest 08/05/2010. FINDINGS: The lungs are clear. No pneumothorax or pleural effusion. Small hiatal hernia is noted. No acute bony abnormality. Vertebral augmentation upper lumbar spine is noted. IMPRESSION: No acute disease. Small hiatal hernia. Electronically Signed   By: Inge Rise M.D.   On: 09/18/2015 18:38   Dg Wrist Complete Right  09/18/2015  CLINICAL DATA:  Fall, right wrist pain EXAM: RIGHT WRIST - COMPLETE 3+ VIEW COMPARISON:  None. FINDINGS: Diffuse osteopenia. Arthritis of the first carpal metacarpal joint. No fracture or dislocation. IMPRESSION:  No acute findings Electronically Signed   By: Skipper Cliche M.D.   On: 09/18/2015 18:36   Ct Head Wo Contrast  09/18/2015  CLINICAL DATA:  Trip and fall injury, striking left side of head on concrete. Loss of consciousness. Swelling over the left supraorbital area. C-collar. EXAM: CT HEAD WITHOUT CONTRAST CT CERVICAL SPINE WITHOUT CONTRAST TECHNIQUE: Multidetector CT imaging of the head and cervical spine was performed following the standard protocol without intravenous contrast. Multiplanar CT image reconstructions of the cervical spine were also generated. COMPARISON:  None. FINDINGS: CT HEAD FINDINGS Diffuse cerebral atrophy. Minimal ventricular dilatation consistent with central atrophy. Patchy and confluent areas of low-attenuation change in the deep white matter consistent with small vessel ischemic change. Subcutaneous scalp hematoma over the left anterior frontal region. No mass effect or midline shift. No abnormal extra-axial fluid collections. Gray-white matter junctions are distinct. Basal cisterns are not effaced. No evidence of acute intracranial hemorrhage. No depressed skull fractures. Visualized paranasal sinuses and mastoid air cells are not opacified. Vascular calcifications. CT CERVICAL SPINE FINDINGS Slight anterior subluxation of C3  on C4. Otherwise normal alignment of the cervical spine. This may be due to degenerative change but ligamentous injury is not excluded and correlation physical examination is recommended. Degenerative changes throughout the cervical spine with narrowed interspaces and endplate hypertrophic changes. Degenerative changes in the cervical facet joint. Sclerosis in the inferior endplate of C7 without cortical disruption. This likely represents degenerative change. Degenerative changes also demonstrated at C1 to. No prevertebral soft tissue swelling. No vertebral compression deformities. No focal bone lesion or bone destruction. Vascular calcifications in the cervical carotid arteries. Soft tissues are unremarkable. Visualized lung apices are clear. IMPRESSION: 1. No acute intracranial abnormalities. Chronic atrophy and small vessel ischemic changes suggested. Subcutaneous scalp hematoma over the left anterior frontal region. 2. Diffuse degenerative change throughout the cervical spine. Slight anterior subluxation at C3-4 is probably degenerative but ligamentous injury is not excluded. No acute displaced fractures are identified. Electronically Signed   By: Lucienne Capers M.D.   On: 09/18/2015 18:24   Ct Cervical Spine Wo Contrast  09/18/2015  CLINICAL DATA:  Trip and fall injury, striking left side of head on concrete. Loss of consciousness. Swelling over the left supraorbital area. C-collar. EXAM: CT HEAD WITHOUT CONTRAST CT CERVICAL SPINE WITHOUT CONTRAST TECHNIQUE: Multidetector CT imaging of the head and cervical spine was performed following the standard protocol without intravenous contrast. Multiplanar CT image reconstructions of the cervical spine were also generated. COMPARISON:  None. FINDINGS: CT HEAD FINDINGS Diffuse cerebral atrophy. Minimal ventricular dilatation consistent with central atrophy. Patchy and confluent areas of low-attenuation change in the deep white matter consistent with small vessel  ischemic change. Subcutaneous scalp hematoma over the left anterior frontal region. No mass effect or midline shift. No abnormal extra-axial fluid collections. Gray-white matter junctions are distinct. Basal cisterns are not effaced. No evidence of acute intracranial hemorrhage. No depressed skull fractures. Visualized paranasal sinuses and mastoid air cells are not opacified. Vascular calcifications. CT CERVICAL SPINE FINDINGS Slight anterior subluxation of C3 on C4. Otherwise normal alignment of the cervical spine. This may be due to degenerative change but ligamentous injury is not excluded and correlation physical examination is recommended. Degenerative changes throughout the cervical spine with narrowed interspaces and endplate hypertrophic changes. Degenerative changes in the cervical facet joint. Sclerosis in the inferior endplate of C7 without cortical disruption. This likely represents degenerative change. Degenerative changes also demonstrated at C1 to. No prevertebral soft tissue swelling.  No vertebral compression deformities. No focal bone lesion or bone destruction. Vascular calcifications in the cervical carotid arteries. Soft tissues are unremarkable. Visualized lung apices are clear. IMPRESSION: 1. No acute intracranial abnormalities. Chronic atrophy and small vessel ischemic changes suggested. Subcutaneous scalp hematoma over the left anterior frontal region. 2. Diffuse degenerative change throughout the cervical spine. Slight anterior subluxation at C3-4 is probably degenerative but ligamentous injury is not excluded. No acute displaced fractures are identified. Electronically Signed   By: Lucienne Capers M.D.   On: 09/18/2015 18:24   I have personally reviewed and evaluated these images and lab results as part of my medical decision-making.   EKG Interpretation   Date/Time:  Tuesday September 18 2015 19:04:32 EDT Ventricular Rate:  79 PR Interval:  174 QRS Duration: 102 QT Interval:   412 QTC Calculation: 472 R Axis:   52 Text Interpretation:  Sinus rhythm Borderline low voltage, extremity leads  no acute ischemia No significant change since No significant change since  last tracing Confirmed by Gerald Leitz ([id number]) on 09/18/2015 7:56:02  PM      MDM   Final diagnoses:  None    Patient is an 82 year old female with history of A. fib, on Coumadin, hypothyroidism, history of CVA presenting today with syncope. Patient was walking outside [place] does not happen and woke up staring at the concrete. Patient sustained abrasion to her left forehead. CT head and neck are negative. Patient has no neck pain.  I would like to admit the patient given patient's risk factors and her complete syncope today. However both her and her husband feel that they want to go home at this time.  I expressed that the patient could have disability or death and patient states "that's a risk I wake up with everyday".  I can not force  patient to stay and I think she is in her right mind making the decision to leave. She does want any blood work done or further imaging.   I personally performed the services described in this documentation, which was scribed in my presence. The recorded information has been reviewed and is accurate.     Cassi Jenne Julio Alm, MD 09/18/15 1956

## 2015-09-18 NOTE — Discharge Instructions (Signed)
You are welcome to come back at any time if you would like to be admitted and observed. Syncope Syncope is a medical term for fainting or passing out. This means you lose consciousness and drop to the ground. People are generally unconscious for less than 5 minutes. You may have some muscle twitches for up to 15 seconds before waking up and returning to normal. Syncope occurs more often in older adults, but it can happen to anyone. While most causes of syncope are not dangerous, syncope can be a sign of a serious medical problem. It is important to seek medical care.  CAUSES  Syncope is caused by a sudden drop in blood flow to the brain. The specific cause is often not determined. Factors that can bring on syncope include:  Taking medicines that lower blood pressure.  Sudden changes in posture, such as standing up quickly.  Taking more medicine than prescribed.  Standing in one place for too long.  Seizure disorders.  Dehydration and excessive exposure to heat.  Low blood sugar (hypoglycemia).  Straining to have a bowel movement.  Heart disease, irregular heartbeat, or other circulatory problems.  Fear, emotional distress, seeing blood, or severe pain. SYMPTOMS  Right before fainting, you may:  Feel dizzy or light-headed.  Feel nauseous.  See all white or all black in your field of vision.  Have cold, clammy skin. DIAGNOSIS  Your health care provider will ask about your symptoms, perform a physical exam, and perform an electrocardiogram (ECG) to record the electrical activity of your heart. Your health care provider may also perform other heart or blood tests to determine the cause of your syncope which may include:  Transthoracic echocardiogram (TTE). During echocardiography, sound waves are used to evaluate how blood flows through your heart.  Transesophageal echocardiogram (TEE).  Cardiac monitoring. This allows your health care provider to monitor your heart rate and  rhythm in real time.  Holter monitor. This is a portable device that records your heartbeat and can help diagnose heart arrhythmias. It allows your health care provider to track your heart activity for several days, if needed.  Stress tests by exercise or by giving medicine that makes the heart beat faster. TREATMENT  In most cases, no treatment is needed. Depending on the cause of your syncope, your health care provider may recommend changing or stopping some of your medicines. HOME CARE INSTRUCTIONS  Have someone stay with you until you feel stable.  Do not drive, use machinery, or play sports until your health care provider says it is okay.  Keep all follow-up appointments as directed by your health care provider.  Lie down right away if you start feeling like you might faint. Breathe deeply and steadily. Wait until all the symptoms have passed.  Drink enough fluids to keep your urine clear or pale yellow.  If you are taking blood pressure or heart medicine, get up slowly and take several minutes to sit and then stand. This can reduce dizziness. SEEK IMMEDIATE MEDICAL CARE IF:   You have a severe headache.  You have unusual pain in the chest, abdomen, or back.  You are bleeding from your mouth or rectum, or you have black or tarry stool.  You have an irregular or very fast heartbeat.  You have pain with breathing.  You have repeated fainting or seizure-like jerking during an episode.  You faint when sitting or lying down.  You have confusion.  You have trouble walking.  You have severe weakness.  You have vision problems. If you fainted, call your local emergency services (911 in [place] Do not drive yourself to the hospital.    This information is not intended to replace advice given to you by your health care provider. Make sure you discuss any questions you have with your health care provider.   Document Released: 06/16/2005 Document Revised: 10/31/2014 Document  Reviewed: 08/15/2011 Elsevier Interactive Patient Education [employer].

## 2015-09-25 DIAGNOSIS — I480 Paroxysmal atrial fibrillation: Secondary | ICD-10-CM | POA: Diagnosis not present

## 2015-09-25 DIAGNOSIS — R55 Syncope and collapse: Secondary | ICD-10-CM | POA: Diagnosis not present

## 2015-09-25 DIAGNOSIS — I10 Essential (primary) hypertension: Secondary | ICD-10-CM | POA: Diagnosis not present

## 2015-09-25 DIAGNOSIS — Z79899 Other long term (current) drug therapy: Secondary | ICD-10-CM | POA: Diagnosis not present

## 2015-09-27 ENCOUNTER — Encounter: Payer: Self-pay | Admitting: Interventional Cardiology

## 2015-09-27 ENCOUNTER — Ambulatory Visit (INDEPENDENT_AMBULATORY_CARE_PROVIDER_SITE_OTHER): Payer: PPO | Admitting: Interventional Cardiology

## 2015-09-27 VITALS — BP 134/79 | HR 85 | Ht 62.0 in | Wt 150.0 lb

## 2015-09-27 DIAGNOSIS — R55 Syncope and collapse: Secondary | ICD-10-CM

## 2015-09-27 DIAGNOSIS — I480 Paroxysmal atrial fibrillation: Secondary | ICD-10-CM

## 2015-09-27 DIAGNOSIS — I10 Essential (primary) hypertension: Secondary | ICD-10-CM | POA: Diagnosis not present
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
## 2015-09-27 NOTE — Patient Instructions (Signed)
**Note De-Identified Bellah Alia Obfuscation** Medication Instructions:  Same-no changes  Labwork: None  Testing/Procedures: Your physician has recommended that you wear an continuous telemetry event monitor. Event monitors are medical devices that record the heart's electrical activity. Doctors most often [place] these monitors to diagnose arrhythmias. Arrhythmias are problems with the speed or rhythm of the heartbeat. The monitor is a small, portable device. You can wear one while you do your normal daily activities. This is usually used to diagnose what is causing palpitations/syncope (passing out).  Your physician has requested that you have an echocardiogram. Echocardiography is a painless test that uses sound waves to create images of your heart. It provides your doctor with information about the size and shape of your heart and how well your heart's chambers and valves are working. This procedure takes approximately one hour. There are no restrictions for this procedure.    Follow-Up: Your physician recommends that you schedule a follow-up appointment in: to be determined     If you need a refill on your cardiac medications before your next appointment, please call your pharmacy.

## 2015-10-02 ENCOUNTER — Ambulatory Visit (INDEPENDENT_AMBULATORY_CARE_PROVIDER_SITE_OTHER): Payer: PPO

## 2015-10-02 ENCOUNTER — Ambulatory Visit (INDEPENDENT_AMBULATORY_CARE_PROVIDER_SITE_OTHER): Payer: PPO | Admitting: Pharmacist

## 2015-10-02 DIAGNOSIS — I480 Paroxysmal atrial fibrillation: Secondary | ICD-10-CM | POA: Diagnosis not present

## 2015-10-02 DIAGNOSIS — R55 Syncope and collapse: Secondary | ICD-10-CM

## 2015-10-02 DIAGNOSIS — I4891 Unspecified atrial fibrillation: Secondary | ICD-10-CM

## 2015-10-02 DIAGNOSIS — Z5181 Encounter for therapeutic drug level monitoring: Secondary | ICD-10-CM | POA: Diagnosis not present

## 2015-10-02 DIAGNOSIS — I6350 Cerebral infarction due to unspecified occlusion or stenosis of unspecified cerebral artery: Secondary | ICD-10-CM | POA: Diagnosis not present

## 2015-10-02 LAB — POCT INR: INR: 3

## 2015-10-08 ENCOUNTER — Telehealth: Payer: Self-pay | Admitting: Interventional Cardiology

## 2015-10-08 NOTE — Telephone Encounter (Signed)
Pt states that she changes her leads on Thursday and did not want to waste them by coming in Friday and having to remove them. Informed pt that she would not have to remove leads to do her echo.   Pt verbalized understanding and was appreciative for call back.

## 2015-10-08 NOTE — Telephone Encounter (Signed)
New message      Pt is wearing an event monitor.  She is scheduled for an echo on fri the 14th.  Can she wait and come in for the echo after the monitor is off?  She has another 3 weeks to go.  She said she does not want to take the monitor off in order to have the echo.  Please call

## 2015-10-12 ENCOUNTER — Other Ambulatory Visit: Payer: Self-pay

## 2015-10-12 ENCOUNTER — Ambulatory Visit (HOSPITAL_COMMUNITY): Payer: PPO | Attending: Cardiovascular Disease

## 2015-10-12 DIAGNOSIS — Z853 Personal history of malignant neoplasm of breast: Secondary | ICD-10-CM | POA: Insufficient documentation

## 2015-10-12 DIAGNOSIS — R55 Syncope and collapse: Secondary | ICD-10-CM | POA: Insufficient documentation

## 2015-10-12 DIAGNOSIS — E785 Hyperlipidemia, unspecified: Secondary | ICD-10-CM | POA: Insufficient documentation

## 2015-10-12 DIAGNOSIS — Z923 Personal history of irradiation: Secondary | ICD-10-CM | POA: Insufficient documentation

## 2015-10-12 DIAGNOSIS — I119 Hypertensive heart disease without heart failure: Secondary | ICD-10-CM | POA: Diagnosis not present

## 2015-10-12 DIAGNOSIS — Z8249 Family history of ischemic heart disease and other diseases of the circulatory system: Secondary | ICD-10-CM | POA: Diagnosis not present

## 2015-10-17 ENCOUNTER — Other Ambulatory Visit: Payer: Self-pay

## 2015-10-17 DIAGNOSIS — C50412 Malignant neoplasm of upper-outer quadrant of left female breast: Secondary | ICD-10-CM | POA: Insufficient documentation

## 2015-10-18 ENCOUNTER — Other Ambulatory Visit (HOSPITAL_BASED_OUTPATIENT_CLINIC_OR_DEPARTMENT_OTHER): Payer: PPO

## 2015-10-18 DIAGNOSIS — C50412 Malignant neoplasm of upper-outer quadrant of left female breast: Secondary | ICD-10-CM

## 2015-10-18 LAB — COMPREHENSIVE METABOLIC PANEL
ALBUMIN: 4.2 g/dL (ref 3.5–5.0)
ALK PHOS: 73 U/L (ref 40–150)
ALT: 20 U/L (ref 0–55)
ANION GAP: 8 meq/L (ref 3–11)
AST: 28 U/L (ref 5–34)
BILIRUBIN TOTAL: 0.65 mg/dL (ref 0.20–1.20)
BUN: 10.8 mg/dL (ref 7.0–26.0)
CO2: 29 mEq/L (ref 22–29)
Calcium: 9.6 mg/dL (ref 8.4–10.4)
Chloride: 104 mEq/L (ref 98–109)
Creatinine: 0.8 mg/dL (ref 0.6–1.1)
EGFR: 66 mL/min/{1.73_m2} — AB (ref >90)
Glucose: 107 mg/dl (ref 70–140)
POTASSIUM: 3.5 meq/L (ref 3.5–5.1)
Sodium: 141 mEq/L (ref 136–145)
TOTAL PROTEIN: 7.3 g/dL (ref 6.4–8.3)

## 2015-10-18 LAB — CBC WITH DIFFERENTIAL/PLATELET
BASO%: 0.7 % (ref 0.0–2.0)
BASOS ABS: 0 10*3/uL (ref 0.0–0.1)
EOS ABS: 0.2 10*3/uL (ref 0.0–0.5)
EOS%: 2.7 % (ref 0.0–7.0)
HCT: 45.8 % (ref 34.8–46.6)
HEMOGLOBIN: 15.4 g/dL (ref 11.6–15.9)
LYMPH%: 17.4 % (ref 14.0–49.7)
MCH: 29.5 pg (ref 25.1–34.0)
MCHC: 33.7 g/dL (ref 31.5–36.0)
MCV: 87.6 fL (ref 79.5–101.0)
MONO#: 0.4 10*3/uL (ref 0.1–0.9)
MONO%: 7 % (ref 0.0–14.0)
NEUT%: 72.2 % (ref 38.4–76.8)
NEUTROS ABS: 4.2 10*3/uL (ref 1.5–6.5)
PLATELETS: 192 10*3/uL (ref 145–400)
RBC: 5.22 10*6/uL (ref 3.70–5.45)
RDW: 13.6 % (ref 11.2–14.5)
WBC: 5.9 10*3/uL (ref 3.9–10.3)
lymph#: 1 10*3/uL (ref 0.9–3.3)

## 2015-10-19 ENCOUNTER — Telehealth: Payer: Self-pay | Admitting: *Deleted

## 2015-10-19 NOTE — Telephone Encounter (Signed)
PT  NOTIFIED  RE THE  RECENT  FAX  FROM [facility].   CRITICAL   READING  WAS NOTED PT IN  A FIB AT A OF   RATE  80 -100 WITH NORMAL  ACTIVITY WITH   PT  PUSHING  RECORD  BUTTON the patient WAS SYMPTOMATIC.REVIEWED WITH  DR Meda Coffee. PER  DR NELSON  KNOWN  PAF RATE CONTROL ON  ANTICOAGULATION  WITH COUMADIN.

## 2015-10-22 ENCOUNTER — Telehealth: Payer: Self-pay | Admitting: *Deleted

## 2015-10-22 NOTE — Telephone Encounter (Signed)
Pt notified of echo results by phone with verbal understanding. Pt asked about who reads the monitor report and will she be made aware of those results as well. I explained Dr. Irish Lack will read the report, his nurse will call with results. Pt said ok

## 2015-10-23 ENCOUNTER — Other Ambulatory Visit: Payer: Self-pay

## 2015-10-23 DIAGNOSIS — Z853 Personal history of malignant neoplasm of breast: Secondary | ICD-10-CM

## 2015-10-23 MED ORDER — LEVOTHYROXINE SODIUM 50 MCG PO TABS: 50.0000 ug | ORAL_TABLET | Freq: Every day | ORAL | Status: AC

## 2015-10-25 ENCOUNTER — Ambulatory Visit (HOSPITAL_BASED_OUTPATIENT_CLINIC_OR_DEPARTMENT_OTHER): Payer: PPO | Admitting: Oncology

## 2015-10-25 VITALS — BP 142/62 | HR 65 | Temp 98.1°F | Resp 18 | Ht 62.0 in | Wt 149.6 lb

## 2015-10-25 DIAGNOSIS — M8580 Other specified disorders of bone density and structure, unspecified site: Secondary | ICD-10-CM

## 2015-10-25 DIAGNOSIS — Z170 Estrogen receptor positive status [ER+]: Secondary | ICD-10-CM | POA: Diagnosis not present

## 2015-10-25 DIAGNOSIS — C50412 Malignant neoplasm of upper-outer quadrant of left female breast: Secondary | ICD-10-CM

## 2015-10-25 DIAGNOSIS — C50411 Malignant neoplasm of upper-outer quadrant of right female breast: Secondary | ICD-10-CM | POA: Diagnosis not present

## 2015-10-25 DIAGNOSIS — Z79811 Long term (current) use of aromatase inhibitors: Secondary | ICD-10-CM
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
## 2015-10-30 ENCOUNTER — Ambulatory Visit (INDEPENDENT_AMBULATORY_CARE_PROVIDER_SITE_OTHER): Payer: PPO | Admitting: Pharmacist

## 2015-10-30 DIAGNOSIS — I4891 Unspecified atrial fibrillation: Secondary | ICD-10-CM

## 2015-10-30 DIAGNOSIS — I480 Paroxysmal atrial fibrillation: Secondary | ICD-10-CM | POA: Diagnosis not present

## 2015-10-30 DIAGNOSIS — I6350 Cerebral infarction due to unspecified occlusion or stenosis of unspecified cerebral artery: Secondary | ICD-10-CM | POA: Diagnosis not present

## 2015-10-30 DIAGNOSIS — Z5181 Encounter for therapeutic drug level monitoring: Secondary | ICD-10-CM | POA: Diagnosis not present

## 2015-10-30 LAB — POCT INR: INR: 2.1

## 2015-11-13 ENCOUNTER — Telehealth: Payer: Self-pay | Admitting: Interventional Cardiology

## 2015-11-13 NOTE — Telephone Encounter (Signed)
The pt is advised per, Dr Irish Lack, that her event monitor results showed that her HR was elevated up to 170 bmp and because she reported several near syncopal episodes while wearing the monitor he is referring her to EP.  The pt verbalized understanding and is in agreement with plan. She is aware that someone will be contacting her from this office to arrange date and time of EP apt.

## 2015-11-13 NOTE — Telephone Encounter (Signed)
New Message  Pt calling to discuss monitor results. Please call back and discuss.

## 2015-11-28 ENCOUNTER — Ambulatory Visit (INDEPENDENT_AMBULATORY_CARE_PROVIDER_SITE_OTHER): Payer: PPO | Admitting: Internal Medicine

## 2015-11-28 ENCOUNTER — Encounter: Payer: Self-pay | Admitting: Internal Medicine

## 2015-11-28 VITALS — BP 134/68 | HR 84 | Ht 62.0 in | Wt 151.0 lb

## 2015-11-28 DIAGNOSIS — R55 Syncope and collapse: Secondary | ICD-10-CM | POA: Diagnosis not present

## 2015-11-28 NOTE — Patient Instructions (Signed)
Medication Instructions:  Your physician has recommended you make the following change in your medication:  1)Stop HCTZ    Labwork: None ordered   Testing/Procedures: LINQ implant suggested:  Dates to do procedure are 6/5, 6/8, 6/12,6/21,6/23,6/27,6/29-- Call [phone] and leave a message for Claiborne Billings if yu decide to proceed  Follow-Up: Your physician recommends that you schedule a follow-up appointment as needed unless you decide to proceed with LINQ implant.  Will schedule follow up after implant   Any Other Special Instructions Will Be Listed Below (If Applicable).     If you need a refill on your cardiac medications before your next appointment, please call your pharmacy.

## 2015-11-28 NOTE — Progress Notes (Signed)
HPI [name] is referred today by Dr. Irish [name] for evaluation of syncope and PAF. She is has been seen in the past and found to have no significant bradycardia on cardiac exam, and the patient has had documented atrial fib and atrial tachycardia and has been treated with warfarin. She has had no documented long pauses. When she has passed out, there was minimal warning. She states she was coming out of a store when she suddenly collapsed. She thinks she was out only a few seconds and did not bite her tongue or lose continence. No prolonged periods of confusion. She does not have chest pain. No Known Allergies   Current Outpatient Prescriptions  Medication Sig Dispense Refill  . acetaminophen (TYLENOL) 325 MG tablet Take 650 mg by mouth 2 (two) times a week. As needed for pain    . alendronate (FOSAMAX) 70 MG tablet TAKE 1 TABLET BY MOUTH WEEKLY AS DIRECTED 12 tablet PRN  . amLODipine (NORVASC) 5 MG tablet Take 5 mg by mouth daily.      . calcium-vitamin D (OSCAL WITH D) 500-200 MG-UNIT per tablet Take 1 tablet by mouth daily.      . hydrochlorothiazide (HYDRODIURIL) 25 MG tablet Take 25 mg by mouth daily.      Marland Kitchen JANTOVEN 5 MG tablet TAKE AS DIRECTED BY COUMADIN CLINIC 40 tablet 3  . levothyroxine (SYNTHROID, LEVOTHROID) 50 MCG tablet Take 1 tablet (50 mcg total) by mouth daily. 90 tablet 3  . Multiple Vitamin (MULTIVITAMIN WITH MINERALS) TABS Take 1 tablet by mouth daily.    . Potassium Chloride CR (MICRO-K) 8 MEQ CPCR capsule CR Take 8 mEq by mouth 2 (two) times daily.     . pravastatin (PRAVACHOL) 10 MG tablet Take 10 mg by mouth daily.      . traMADol (ULTRAM) 50 MG tablet Take 50 mg by mouth as needed for moderate pain.      No current facility-administered medications for this visit.     Past Medical History  Diagnosis Date  . Stroke ([place]) 03/18/03  . Breast lump   . Bruises easily   . Thyroid disease   . Hypertension   . Heart disease   . Incontinence   . Wears  glasses   . Atrial fibrillation ([place])   . Hypercholesterolemia   . Osteopenia   . Cancer ([place])   . Breast cancer ([place]) 06/19/10 biopsy     right, inv mammary, ER/PR +, hER2 -  . Breast CA ([place]) 08/06/10    R lumpectomy  . Hx of radiation therapy 09/04/10 to 10/02/10    R breast  . Arrhythmia     PAF  . Dyslipidemia   . Hypothyroidism     ROS:   All systems reviewed and negative except as noted in the HPI.   Past Surgical History  Procedure Laterality Date  . Appendectomy    . Ovarian cyst surgery    . Replacement total knee bilateral  08/12/10  . Breast lumpectomy  06/2008  . Breast lumpectomy  08/06/2010    R, INV LOBULAR, DCIS, ER/PR +, HER2-  . Cataract extraction, bilateral    . Tonsillectomy and adenoidectomy    . Abdominal hysterectomy      unilat bso     Family History  Problem Relation Age of Onset  . Cancer Brother     esophagus  . Cancer Mother     breast  . Heart attack Father  Social History   Social History  . Marital Status: Married    Spouse Name: N/A  . Number of Children: N/A  . Years of Education: N/A   Occupational History  . Not on file.   Social History Main Topics  . Smoking status: Never Smoker   . Smokeless tobacco: Not on file  . Alcohol Use: No  . Drug Use: No  . Sexual Activity: Not on file     Comment: menarche 14, fist preg age 20, hysterectomy '62, no HRT   Other Topics Concern  . Not on file   Social History Narrative     BP 134/68 mmHg  Pulse 84  Ht 5' 2" (1.575 m)  Wt 151 lb (68.493 kg)  BMI 27.61 kg/m2  SpO2 99%  Physical Exam:  Well appearing 82 yo woman, NAD HEENT: Unremarkable Neck:  No JVD, no thyromegally Lymphatics:  No adenopathy Back:  No CVA tenderness Lungs:  Clear with no wheezes HEART:  Regular rate rhythm, no murmurs, no rubs, no clicks Abd:  soft, positive bowel sounds, no organomegally, no rebound, no guarding Ext:  2 plus pulses, no edema, no cyanosis, no clubbing Skin:  No rashes no  nodules Neuro:  CN II through XII intact, motor grossly intact  EKG - reviewed - nsr  Heart monitor - reviewed - nsr with atrial tachycardia  Assess/Plan: 1. Syncope - I suspect she has autonomic dysfunction. I have asked her to stop her HCTZ. I discussed the physiology with her.  I have offered her an ILR. She will call us if she would like to proceed. 2. PAF - she is anti-coagulated. She is not symptomatic enough to recommened an AA drug. 3. HTN - her blood pressure is well controlled. No change in meds.  [name],M.D. 

## 2015-12-10 ENCOUNTER — Ambulatory Visit (INDEPENDENT_AMBULATORY_CARE_PROVIDER_SITE_OTHER): Payer: PPO | Admitting: *Deleted

## 2015-12-10 DIAGNOSIS — L409 Psoriasis, unspecified: Secondary | ICD-10-CM | POA: Diagnosis not present

## 2015-12-10 DIAGNOSIS — I480 Paroxysmal atrial fibrillation: Secondary | ICD-10-CM | POA: Diagnosis not present

## 2015-12-10 DIAGNOSIS — Z5181 Encounter for therapeutic drug level monitoring: Secondary | ICD-10-CM | POA: Diagnosis not present

## 2015-12-10 DIAGNOSIS — I6350 Cerebral infarction due to unspecified occlusion or stenosis of unspecified cerebral artery: Secondary | ICD-10-CM | POA: Diagnosis not present

## 2015-12-10 DIAGNOSIS — R55 Syncope and collapse: Secondary | ICD-10-CM | POA: Diagnosis not present

## 2015-12-10 DIAGNOSIS — I4891 Unspecified atrial fibrillation: Secondary | ICD-10-CM | POA: Diagnosis not present

## 2015-12-10 DIAGNOSIS — I10 Essential (primary) hypertension: Secondary | ICD-10-CM | POA: Diagnosis not present

## 2015-12-10 LAB — POCT INR: INR: 2.7

## 2015-12-25 DIAGNOSIS — Z79899 Other long term (current) drug therapy: Secondary | ICD-10-CM | POA: Diagnosis not present

## 2015-12-25 DIAGNOSIS — I10 Essential (primary) hypertension: Secondary | ICD-10-CM | POA: Diagnosis not present

## 2015-12-25 DIAGNOSIS — Z0000 Encounter for general adult medical examination without abnormal findings: Secondary | ICD-10-CM | POA: Diagnosis not present

## 2015-12-25 DIAGNOSIS — E039 Hypothyroidism, unspecified: Secondary | ICD-10-CM | POA: Diagnosis not present

## 2015-12-25 DIAGNOSIS — E7800 Pure hypercholesterolemia, unspecified: Secondary | ICD-10-CM | POA: Diagnosis not present

## 2015-12-25 DIAGNOSIS — Z1389 Encounter for screening for other disorder: Secondary | ICD-10-CM | POA: Diagnosis not present

## 2015-12-25 DIAGNOSIS — I480 Paroxysmal atrial fibrillation: Secondary | ICD-10-CM | POA: Diagnosis not present

## 2016-01-21 DIAGNOSIS — Z79899 Other long term (current) drug therapy: Secondary | ICD-10-CM | POA: Diagnosis not present

## 2016-01-24 ENCOUNTER — Ambulatory Visit (INDEPENDENT_AMBULATORY_CARE_PROVIDER_SITE_OTHER): Payer: PPO | Admitting: *Deleted

## 2016-01-24 DIAGNOSIS — Z5181 Encounter for therapeutic drug level monitoring: Secondary | ICD-10-CM

## 2016-01-24 DIAGNOSIS — I6350 Cerebral infarction due to unspecified occlusion or stenosis of unspecified cerebral artery: Secondary | ICD-10-CM

## 2016-01-24 DIAGNOSIS — I4891 Unspecified atrial fibrillation: Secondary | ICD-10-CM | POA: Diagnosis not present

## 2016-01-24 LAB — POCT INR: INR: 2.4

## 2016-01-29 ENCOUNTER — Other Ambulatory Visit: Payer: Self-pay | Admitting: Interventional Cardiology

## 2016-01-30 ENCOUNTER — Other Ambulatory Visit: Payer: Self-pay | Admitting: Interventional Cardiology

## 2016-02-08 ENCOUNTER — Telehealth: Payer: Self-pay | Admitting: Interventional Cardiology

## 2016-02-08 NOTE — Telephone Encounter (Signed)
Called [name] in order to answer some questions about LINQ monitoring/ implant.  She was curious about home monitoring and if she had to keep a monitor with her. I explained that the implanted loop recorder would record any slow HRs and pauses that might be contributing to her dizziness, and that her home monitor would transmit any recorded episodes to [place] each night between midnight and 5am.  I explained this takes place in short stay and most likely she will go home that same day, it is a very brief procedure. Monthly billing, nightly monitoring. She will call billing with insurance coverage questions. She is aware to call back to the main number and ask for Claiborne Billings if she wishes to proceed with scheduling.

## 2016-02-08 NOTE — Telephone Encounter (Signed)
Per Dr Marlou Porch the pt is advised that she needs a LINK as advised by Dr Lovena Le at her last OV with him on 5/31.  The pt is The pt is requesting that a device tech call her and explain device [facility]) that Dr Lovena Le recommended to her at her last OV with him. Please call her to discuss.

## 2016-02-08 NOTE — Telephone Encounter (Signed)
Per pt call:  Pt has not been feeling well  A lot of dizziness, caold seawts. AFIB  Pt only wants to see DR

## 2016-03-05 ENCOUNTER — Ambulatory Visit: Payer: PPO | Admitting: Interventional Cardiology

## 2016-03-13 ENCOUNTER — Ambulatory Visit (INDEPENDENT_AMBULATORY_CARE_PROVIDER_SITE_OTHER): Payer: PPO | Admitting: *Deleted

## 2016-03-13 DIAGNOSIS — I4891 Unspecified atrial fibrillation: Secondary | ICD-10-CM

## 2016-03-13 DIAGNOSIS — I6350 Cerebral infarction due to unspecified occlusion or stenosis of unspecified cerebral artery: Secondary | ICD-10-CM

## 2016-03-13 DIAGNOSIS — Z5181 Encounter for therapeutic drug level monitoring: Secondary | ICD-10-CM | POA: Diagnosis not present

## 2016-03-13 LAB — POCT INR: INR: 2

## 2016-04-24 ENCOUNTER — Ambulatory Visit (INDEPENDENT_AMBULATORY_CARE_PROVIDER_SITE_OTHER): Payer: PPO | Admitting: Pharmacist

## 2016-04-24 DIAGNOSIS — I4891 Unspecified atrial fibrillation: Secondary | ICD-10-CM

## 2016-04-24 DIAGNOSIS — I6350 Cerebral infarction due to unspecified occlusion or stenosis of unspecified cerebral artery: Secondary | ICD-10-CM | POA: Diagnosis not present

## 2016-04-24 DIAGNOSIS — Z5181 Encounter for therapeutic drug level monitoring: Secondary | ICD-10-CM

## 2016-04-24 LAB — POCT INR: INR: 2.9

## 2016-05-20 DIAGNOSIS — I480 Paroxysmal atrial fibrillation: Secondary | ICD-10-CM | POA: Diagnosis not present

## 2016-05-20 DIAGNOSIS — I10 Essential (primary) hypertension: Secondary | ICD-10-CM | POA: Diagnosis not present

## 2016-05-21 IMAGING — CT CT CERVICAL SPINE W/O CM
3 of 5 series · 12 of 33 positions shown, 14 images · non-contrast
Comparison: None.

CLINICAL DATA: Trip and fall injury, striking left side of head on
concrete. Loss of consciousness. Swelling over the left supraorbital
area. C-collar.

EXAM:
CT HEAD WITHOUT CONTRAST
CT CERVICAL SPINE WITHOUT CONTRAST
TECHNIQUE: Multidetector CT imaging of the head and cervical spine was
performed following the standard protocol without intravenous
contrast. Multiplanar CT image reconstructions of the cervical spine
were also generated.

[Series 7: sagittal bone · sagittal · 0.23mm/px · 5 of 36 slices shown, 6 images]
[im 12/36  bone]
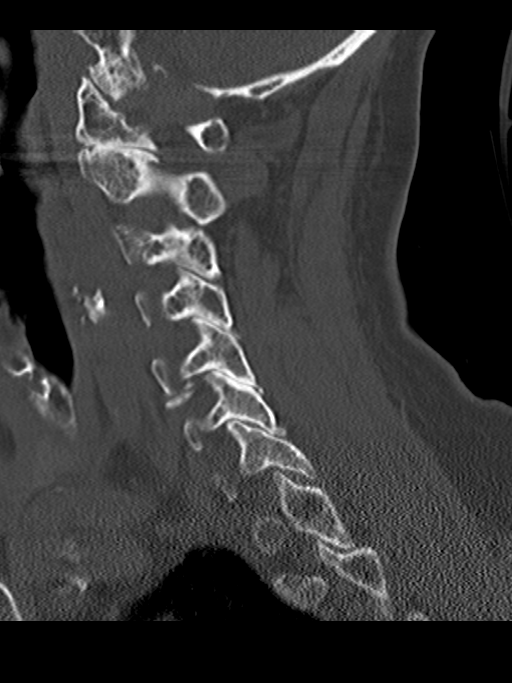
[im 15/36  bone]
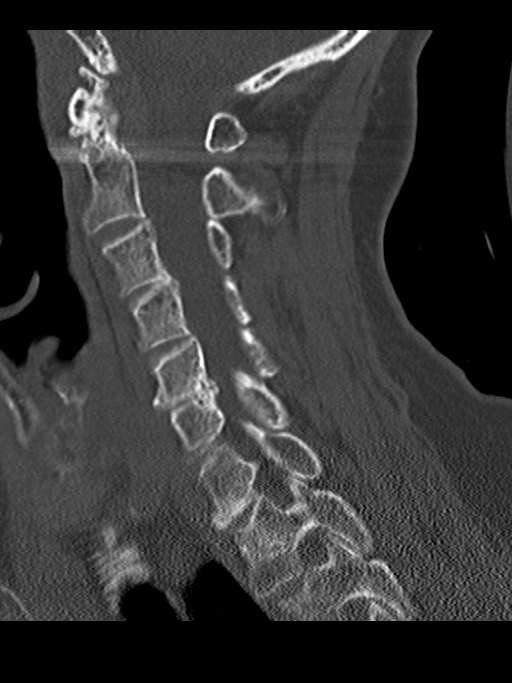
[im 18/36  soft-tissue]
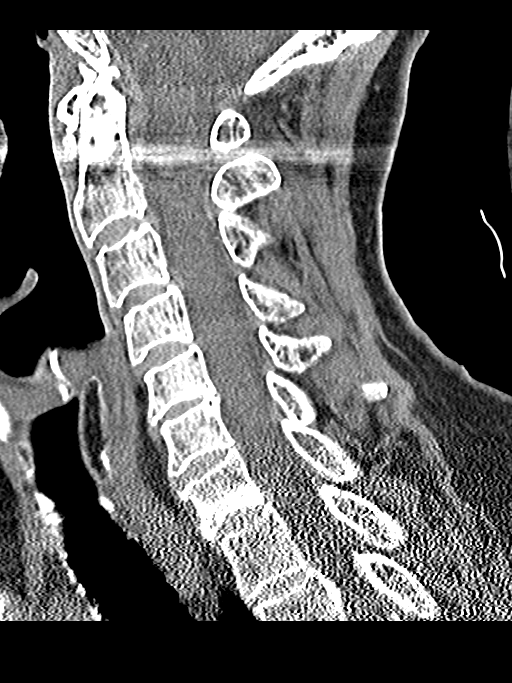
[im 18/36  bone]
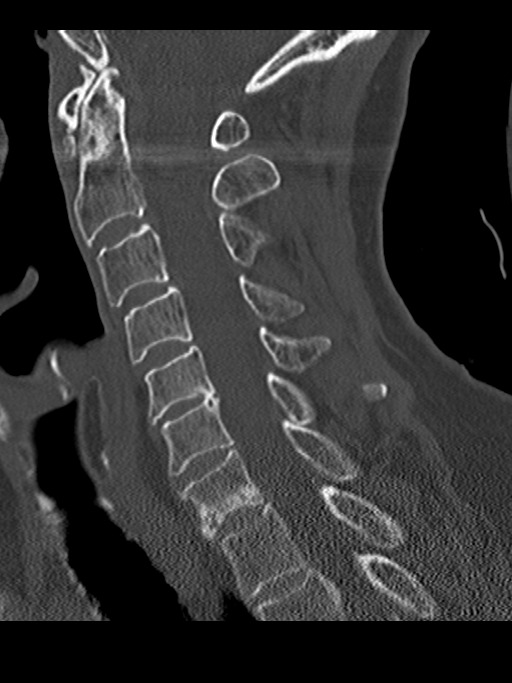
[im 21/36  bone]
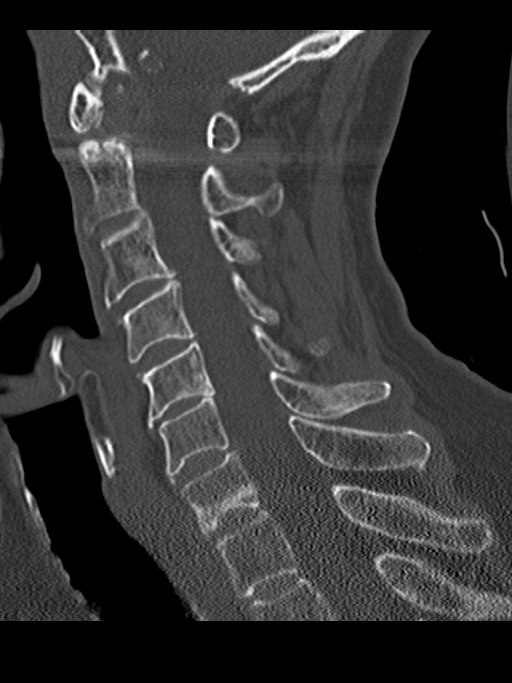
[im 24/36  bone]
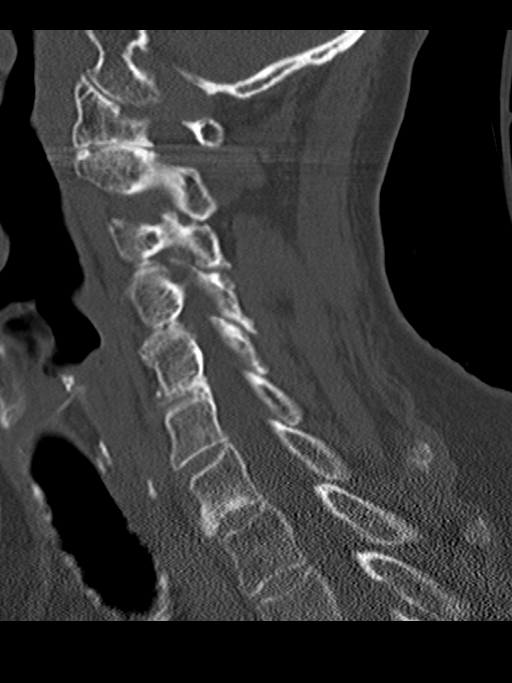

[Series 8: coronal bone · coronal · 0.20mm/px · 3 of 29 slices shown]
[im 6/29  bone]
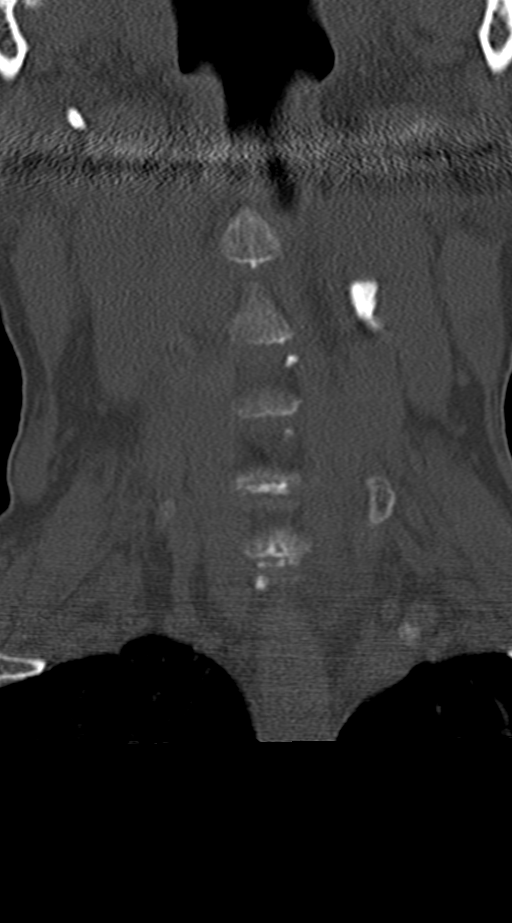
[im 12/29  bone]
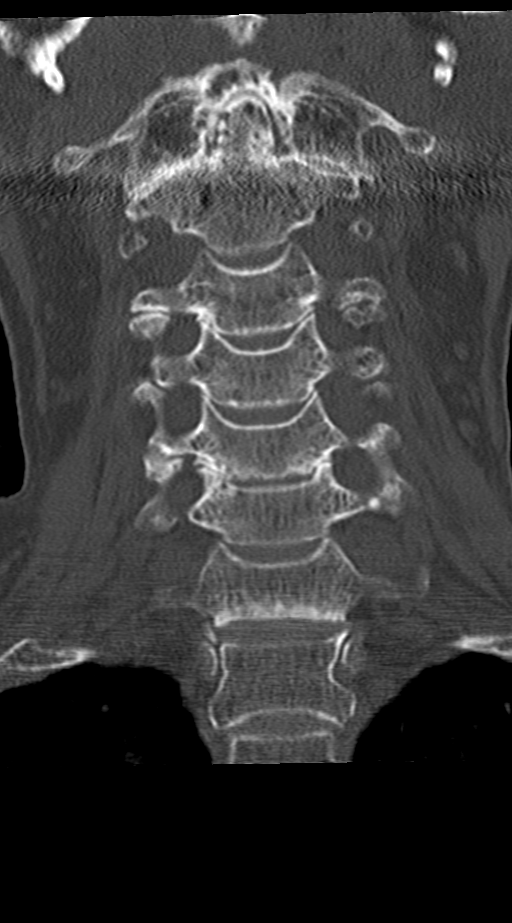
[im 17/29  bone]
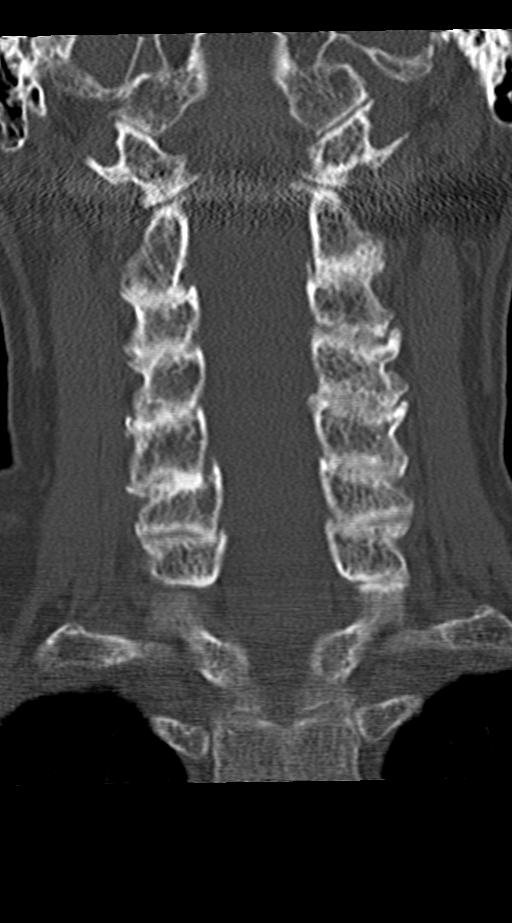

[Series 9: orthogonal axials · axial · 0.23mm/px · z∈[-276,-193]mm · 4 of 77 slices shown, 5 images]
[im 16/77  soft-tissue]
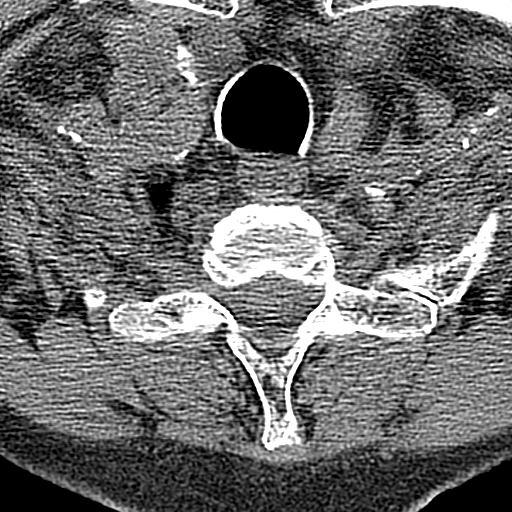
[im 16/77  bone]
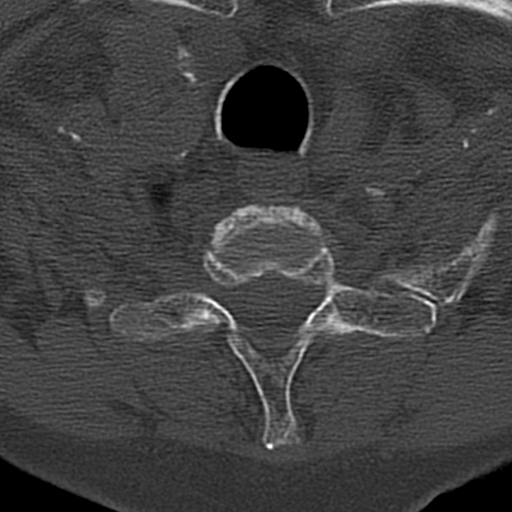
[im 31/77  bone]
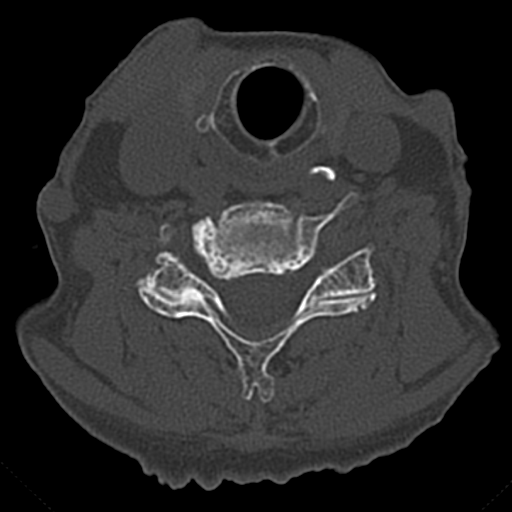
[im 46/77  bone]
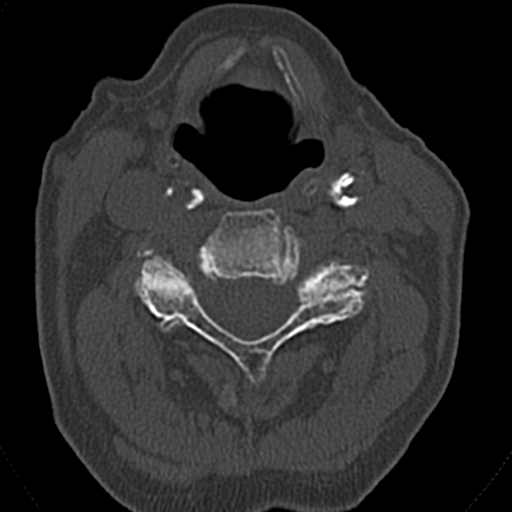
[im 61/77  bone]
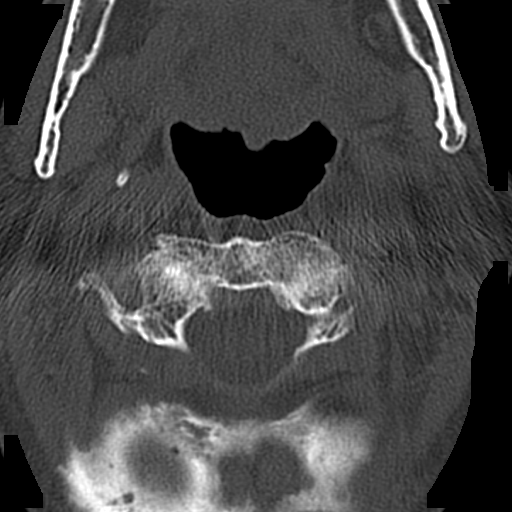

[12 of 33 positions shown; findings below may reference images not displayed]

FINDINGS: CT HEAD FINDINGS

Diffuse cerebral atrophy. Minimal ventricular dilatation consistent
with central atrophy. Patchy and confluent areas of low-attenuation
change in the deep white matter consistent with small vessel
ischemic change. Subcutaneous scalp hematoma over the left anterior
frontal region. No mass effect or midline shift. No abnormal
extra-axial fluid collections. Gray-white matter junctions are
distinct. Basal cisterns are not effaced. No evidence of acute
intracranial hemorrhage. No depressed skull fractures. Visualized
paranasal sinuses and mastoid air cells are not opacified. Vascular
calcifications.

CT CERVICAL SPINE FINDINGS

Slight anterior subluxation of C3 on C4. Otherwise normal alignment
of the cervical spine. This may be due to degenerative change but
ligamentous injury is not excluded and correlation physical
examination is recommended. Degenerative changes throughout the
cervical spine with narrowed interspaces and endplate hypertrophic
changes. Degenerative changes in the cervical facet joint. Sclerosis
in the inferior endplate of C7 without cortical disruption. This
likely represents degenerative change. Degenerative changes also
demonstrated at C1 to. No prevertebral soft tissue swelling. No
vertebral compression deformities. No focal bone lesion or bone
destruction. Vascular calcifications in the cervical carotid
arteries. Soft tissues are unremarkable. Visualized lung apices are
clear.
IMPRESSION: 1. No acute intracranial abnormalities. Chronic atrophy and small
vessel ischemic changes suggested. Subcutaneous scalp hematoma over
the left anterior frontal region.
2. Diffuse degenerative change throughout the cervical spine. Slight
anterior subluxation at C3-4 is probably degenerative but
ligamentous injury is not excluded. No acute displaced fractures are
identified.

## 2016-06-05 ENCOUNTER — Ambulatory Visit (INDEPENDENT_AMBULATORY_CARE_PROVIDER_SITE_OTHER): Payer: PPO | Admitting: Pharmacist

## 2016-06-05 DIAGNOSIS — Z5181 Encounter for therapeutic drug level monitoring: Secondary | ICD-10-CM

## 2016-06-05 DIAGNOSIS — I4891 Unspecified atrial fibrillation: Secondary | ICD-10-CM | POA: Diagnosis not present

## 2016-06-05 DIAGNOSIS — I6350 Cerebral infarction due to unspecified occlusion or stenosis of unspecified cerebral artery: Secondary | ICD-10-CM | POA: Diagnosis not present

## 2016-06-05 LAB — POCT INR: INR: 2.8

## 2016-06-13 DIAGNOSIS — I10 Essential (primary) hypertension: Secondary | ICD-10-CM | POA: Diagnosis not present

## 2016-06-13 DIAGNOSIS — R21 Rash and other nonspecific skin eruption: Secondary | ICD-10-CM | POA: Diagnosis not present

## 2016-06-13 DIAGNOSIS — Z79899 Other long term (current) drug therapy: Secondary | ICD-10-CM | POA: Diagnosis not present

## 2016-07-22 ENCOUNTER — Ambulatory Visit (INDEPENDENT_AMBULATORY_CARE_PROVIDER_SITE_OTHER): Payer: PPO | Admitting: *Deleted

## 2016-07-22 DIAGNOSIS — I6350 Cerebral infarction due to unspecified occlusion or stenosis of unspecified cerebral artery: Secondary | ICD-10-CM

## 2016-07-22 DIAGNOSIS — Z5181 Encounter for therapeutic drug level monitoring: Secondary | ICD-10-CM

## 2016-07-22 DIAGNOSIS — I4891 Unspecified atrial fibrillation: Secondary | ICD-10-CM

## 2016-07-22 LAB — POCT INR: INR: 2.9

## 2016-07-23 ENCOUNTER — Telehealth: Payer: Self-pay | Admitting: Interventional Cardiology

## 2016-07-23 NOTE — Telephone Encounter (Signed)
OK with me.

## 2016-07-23 NOTE — Telephone Encounter (Signed)
°  Per walk in [name] is requesting to transfer her care to Dr. Cathie Olden .  I explained that you both would need to agree.

## 2016-08-01 ENCOUNTER — Other Ambulatory Visit: Payer: Self-pay | Admitting: Cardiovascular Disease

## 2016-08-01 DIAGNOSIS — I6523 Occlusion and stenosis of bilateral carotid arteries: Secondary | ICD-10-CM

## 2016-08-14 ENCOUNTER — Ambulatory Visit (HOSPITAL_COMMUNITY)
Admission: RE | Admit: 2016-08-14 | Discharge: 2016-08-14 | Disposition: A | Discharge status: Home / Self Care | Payer: PPO | Source: Ambulatory Visit | Attending: Cardiovascular Disease | Admitting: Cardiovascular Disease

## 2016-08-14 DIAGNOSIS — I6523 Occlusion and stenosis of bilateral carotid arteries: Secondary | ICD-10-CM | POA: Insufficient documentation

## 2016-08-14 DIAGNOSIS — Z8673 Personal history of transient ischemic attack (TIA), and cerebral infarction without residual deficits: Secondary | ICD-10-CM | POA: Insufficient documentation

## 2016-08-14 DIAGNOSIS — I10 Essential (primary) hypertension: Secondary | ICD-10-CM | POA: Insufficient documentation

## 2016-08-14 DIAGNOSIS — R42 Dizziness and giddiness: Secondary | ICD-10-CM | POA: Insufficient documentation

## 2016-08-20 ENCOUNTER — Other Ambulatory Visit: Payer: Self-pay | Admitting: Interventional Cardiology

## 2016-08-22 DIAGNOSIS — Z853 Personal history of malignant neoplasm of breast: Secondary | ICD-10-CM | POA: Diagnosis not present

## 2016-08-22 DIAGNOSIS — M8589 Other specified disorders of bone density and structure, multiple sites: Secondary | ICD-10-CM | POA: Diagnosis not present
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
## 2016-08-29 ENCOUNTER — Ambulatory Visit (INDEPENDENT_AMBULATORY_CARE_PROVIDER_SITE_OTHER): Payer: PPO | Admitting: Cardiovascular Disease

## 2016-08-29 ENCOUNTER — Encounter (INDEPENDENT_AMBULATORY_CARE_PROVIDER_SITE_OTHER): Payer: Self-pay

## 2016-08-29 ENCOUNTER — Encounter: Payer: Self-pay | Admitting: Cardiovascular Disease

## 2016-08-29 VITALS — BP 116/74 | HR 79 | Ht 62.0 in | Wt 150.8 lb

## 2016-08-29 DIAGNOSIS — I10 Essential (primary) hypertension: Secondary | ICD-10-CM

## 2016-08-29 DIAGNOSIS — I480 Paroxysmal atrial fibrillation: Secondary | ICD-10-CM | POA: Diagnosis not present

## 2016-08-29 DIAGNOSIS — R55 Syncope and collapse: Secondary | ICD-10-CM

## 2016-08-29 NOTE — Patient Instructions (Signed)
Medication Instructions:  Your physician recommends that you continue on your current medications as directed. Please refer to the Current Medication list given to you today.   Labwork: None Ordered   Testing/Procedures: None Ordered   Follow-Up: Your physician wants you to follow-up in: 6 months with Dr. Nahser.  You will receive a reminder letter in the mail two months in advance. If you don't receive a letter, please call our office to schedule the follow-up appointment.   If you need a refill on your cardiac medications before your next appointment, please call your pharmacy.   Thank you for choosing [facility]! Keali Mccraw, RN [phone]    

## 2016-09-02 ENCOUNTER — Ambulatory Visit (INDEPENDENT_AMBULATORY_CARE_PROVIDER_SITE_OTHER): Payer: PPO | Admitting: *Deleted

## 2016-09-02 DIAGNOSIS — I6350 Cerebral infarction due to unspecified occlusion or stenosis of unspecified cerebral artery: Secondary | ICD-10-CM | POA: Diagnosis not present

## 2016-09-02 DIAGNOSIS — Z5181 Encounter for therapeutic drug level monitoring: Secondary | ICD-10-CM | POA: Diagnosis not present

## 2016-09-02 DIAGNOSIS — I4891 Unspecified atrial fibrillation: Secondary | ICD-10-CM

## 2016-09-02 LAB — POCT INR: INR: 2.9

## 2016-10-02 ENCOUNTER — Other Ambulatory Visit: Payer: Self-pay | Admitting: Oncology

## 2016-10-02 DIAGNOSIS — Z853 Personal history of malignant neoplasm of breast: Secondary | ICD-10-CM

## 2016-10-06 ENCOUNTER — Other Ambulatory Visit: Payer: Self-pay | Admitting: Oncology

## 2016-10-06 ENCOUNTER — Ambulatory Visit (INDEPENDENT_AMBULATORY_CARE_PROVIDER_SITE_OTHER): Payer: PPO | Admitting: *Deleted

## 2016-10-06 DIAGNOSIS — I6350 Cerebral infarction due to unspecified occlusion or stenosis of unspecified cerebral artery: Secondary | ICD-10-CM

## 2016-10-06 DIAGNOSIS — Z5181 Encounter for therapeutic drug level monitoring: Secondary | ICD-10-CM | POA: Diagnosis not present

## 2016-10-06 DIAGNOSIS — I4891 Unspecified atrial fibrillation: Secondary | ICD-10-CM

## 2016-10-06 DIAGNOSIS — Z853 Personal history of malignant neoplasm of breast: Secondary | ICD-10-CM

## 2016-10-06 LAB — POCT INR: INR: 2

## 2016-10-10 DIAGNOSIS — N390 Urinary tract infection, site not specified: Secondary | ICD-10-CM | POA: Diagnosis not present

## 2016-11-13 ENCOUNTER — Ambulatory Visit (INDEPENDENT_AMBULATORY_CARE_PROVIDER_SITE_OTHER): Payer: PPO | Admitting: *Deleted

## 2016-11-13 DIAGNOSIS — Z5181 Encounter for therapeutic drug level monitoring: Secondary | ICD-10-CM | POA: Diagnosis not present

## 2016-11-13 DIAGNOSIS — I4891 Unspecified atrial fibrillation: Secondary | ICD-10-CM | POA: Diagnosis not present

## 2016-11-13 DIAGNOSIS — I6350 Cerebral infarction due to unspecified occlusion or stenosis of unspecified cerebral artery: Secondary | ICD-10-CM | POA: Diagnosis not present

## 2016-11-13 LAB — POCT INR: INR: 1.9

## 2016-11-27 ENCOUNTER — Encounter (HOSPITAL_COMMUNITY): Payer: Self-pay | Admitting: Emergency Medicine

## 2016-11-27 ENCOUNTER — Emergency Department (HOSPITAL_COMMUNITY)
Admission: EM | Admit: 2016-11-27 | Discharge: 2016-11-27 | Disposition: A | Discharge status: Home / Self Care | Payer: PPO | Attending: Physician Assistant | Admitting: Physician Assistant

## 2016-11-27 ENCOUNTER — Emergency Department (HOSPITAL_COMMUNITY): Payer: PPO

## 2016-11-27 DIAGNOSIS — Z853 Personal history of malignant neoplasm of breast: Secondary | ICD-10-CM | POA: Diagnosis not present

## 2016-11-27 DIAGNOSIS — R42 Dizziness and giddiness: Secondary | ICD-10-CM | POA: Insufficient documentation

## 2016-11-27 DIAGNOSIS — Z79899 Other long term (current) drug therapy: Secondary | ICD-10-CM | POA: Insufficient documentation

## 2016-11-27 DIAGNOSIS — I10 Essential (primary) hypertension: Secondary | ICD-10-CM | POA: Diagnosis not present

## 2016-11-27 DIAGNOSIS — E039 Hypothyroidism, unspecified: Secondary | ICD-10-CM | POA: Diagnosis not present

## 2016-11-27 DIAGNOSIS — Z8673 Personal history of transient ischemic attack (TIA), and cerebral infarction without residual deficits: Secondary | ICD-10-CM | POA: Insufficient documentation

## 2016-11-27 DIAGNOSIS — J9811 Atelectasis: Secondary | ICD-10-CM | POA: Diagnosis not present

## 2016-11-27 DIAGNOSIS — R404 Transient alteration of awareness: Secondary | ICD-10-CM | POA: Diagnosis not present

## 2016-11-27 LAB — CBC
HEMATOCRIT: 41.2 % (ref 36.0–46.0)
Hemoglobin: 13.8 g/dL (ref 12.0–15.0)
MCH: 29.7 pg (ref 26.0–34.0)
MCHC: 33.5 g/dL (ref 30.0–36.0)
MCV: 88.6 fL (ref 78.0–100.0)
Platelets: 152 10*3/uL (ref 150–400)
RBC: 4.65 MIL/uL (ref 3.87–5.11)
RDW: 13.6 % (ref 11.5–15.5)
WBC: 5.6 10*3/uL (ref 4.0–10.5)

## 2016-11-27 LAB — COMPREHENSIVE METABOLIC PANEL
ALBUMIN: 3.8 g/dL (ref 3.5–5.0)
ALK PHOS: 51 U/L (ref 38–126)
ALT: 21 U/L (ref 14–54)
AST: 28 U/L (ref 15–41)
Anion gap: 11 (ref 5–15)
BILIRUBIN TOTAL: 0.9 mg/dL (ref 0.3–1.2)
BUN: 10 mg/dL (ref 6–20)
CALCIUM: 8.6 mg/dL — AB (ref 8.9–10.3)
CO2: 25 mmol/L (ref 22–32)
Chloride: 102 mmol/L (ref 101–111)
Creatinine, Ser: 0.74 mg/dL (ref 0.44–1.00)
GFR calc Af Amer: >60 mL/min (ref >60)
GFR calc non Af Amer: >60 mL/min (ref >60)
GLUCOSE: 134 mg/dL — AB (ref 65–99)
Potassium: 3 mmol/L — ABNORMAL LOW (ref 3.5–5.1)
Sodium: 138 mmol/L (ref 135–145)
TOTAL PROTEIN: 6.7 g/dL (ref 6.5–8.1)

## 2016-11-27 LAB — URINALYSIS, ROUTINE W REFLEX MICROSCOPIC
BILIRUBIN URINE: NEGATIVE
Glucose, UA: NEGATIVE mg/dL
Hgb urine dipstick: NEGATIVE
KETONES UR: 5 mg/dL — AB
Leukocytes, UA: NEGATIVE
Nitrite: NEGATIVE
PH: 8 (ref 5.0–8.0)
Protein, ur: NEGATIVE mg/dL
Specific Gravity, Urine: 1.006 (ref 1.005–1.030)

## 2016-11-27 LAB — PROTIME-INR
INR: 2.12
PROTHROMBIN TIME: 24.1 s — AB (ref 11.4–15.2)

## 2016-11-27 LAB — CBG MONITORING, ED: Glucose-Capillary: 146 mg/dL — ABNORMAL HIGH (ref 65–99)

## 2016-11-27 LAB — LIPASE, BLOOD: LIPASE: 29 U/L (ref 11–51)

## 2016-11-27 LAB — I-STAT TROPONIN, ED: Troponin i, poc: 0 ng/mL (ref 0.00–0.08)

## 2016-11-27 LAB — I-STAT CG4 LACTIC ACID, ED: LACTIC ACID, VENOUS: 0.72 mmol/L (ref 0.5–1.9)

## 2016-11-27 MED ORDER — SODIUM CHLORIDE 0.9 % IV BOLUS (SEPSIS)
1000.0000 mL | Freq: Once | INTRAVENOUS | Status: AC
  Administered 2016-11-27: 1000 mL via INTRAVENOUS

## 2016-11-27 MED ORDER — ONDANSETRON HCL 4 MG/2ML IJ SOLN
4.0000 mg | Freq: Once | INTRAMUSCULAR | Status: AC | PRN
  Administered 2016-11-27: 4 mg via INTRAVENOUS
  Filled 2016-11-27: qty 2

## 2016-11-27 NOTE — ED Provider Notes (Signed)
[facility] DEPT Provider Note   CSN: [id number] Arrival date & time: 11/27/16  6734     History   Chief Complaint Chief Complaint  Patient presents with  . Dizziness  . Nausea    HPI [name] is a 83 y.o. female.  HPI   Patient is a 83 year old female presenting with waking up feeling weak. At 3 AM patient woke up feeling clammy. No chest pain. Mild dizziness. No focal numbness or weakness. Patient said she all over and felt tired. Patient has a history of A. fib and dizziness. She's had a Holter monitor in the last year.  No symptoms prior to this starting overnight. No recent URI. No fevers or nausea no vomiting.    Past Medical History:  Diagnosis Date  . Arrhythmia    PAF  . Atrial fibrillation ([place])   . Breast CA ([place]) 08/06/10   R lumpectomy  . Breast cancer ([place]) 06/19/10 biopsy    right, inv mammary, ER/PR +, hER2 -  . Breast lump   . Bruises easily   . Cancer ([place])   . Dyslipidemia   . Heart disease   . Hx of radiation therapy 09/04/10 to 10/02/10   R breast  . Hypercholesterolemia   . Hypertension   . Hypothyroidism   . Incontinence   . Osteopenia   . Stroke ([place]) 03/18/03  . Thyroid disease   . Wears glasses     Patient Active Problem List   Diagnosis Date Noted  . Malignant neoplasm of upper-outer quadrant of left female breast ([place]) 10/17/2015  . PAF (paroxysmal atrial fibrillation) ([place]) 07/26/2015  . TIA (transient ischemic attack) 07/26/2015  . Slurred speech 07/26/2015  . Fatigue 07/26/2015  . Essential hypertension 05/23/2014  . Breast cancer of upper-outer quadrant of left female breast ([place]) 10/04/2013  . Encounter for therapeutic drug monitoring 07/28/2013  . Unspecified cerebral artery occlusion with cerebral infarction 04/12/2013  . Hx of radiation therapy   . Atrial fibrillation ([place])   . History of breast cancer, ILC, left UOQ, receptor + Her 2 - 03/14/2011    Past Surgical History:  Procedure Laterality Date  .  ABDOMINAL HYSTERECTOMY     unilat bso  . APPENDECTOMY    . BREAST LUMPECTOMY  06/2008  . BREAST LUMPECTOMY  08/06/2010   R, INV LOBULAR, DCIS, ER/PR +, HER2-  . CATARACT EXTRACTION, BILATERAL    . OVARIAN CYST SURGERY    . REPLACEMENT TOTAL KNEE BILATERAL  08/12/10  . TONSILLECTOMY AND ADENOIDECTOMY      OB History    No data available       Home Medications    Prior to Admission medications   Medication Sig Start Date End Date Taking? Authorizing Provider  traMADol (ULTRAM) 50 MG tablet Take 50 mg by mouth as needed for moderate pain.  04/12/14  Yes [provider]  acetaminophen (TYLENOL) 325 MG tablet Take 650 mg by mouth 2 (two) times a week. As needed for pain    [provider]  alendronate (FOSAMAX) 70 MG tablet TAKE 1 TABLET BY MOUTH WEEKLY AS DIRECTED 09/15/12   Magrinat, Virgie Dad, MD  amLODipine (NORVASC) 5 MG tablet Take 5 mg by mouth daily.      [provider]  calcium-vitamin D (OSCAL WITH D) 500-200 MG-UNIT per tablet Take 1 tablet by mouth daily.      [provider]  JANTOVEN 5 MG tablet TAKE AS DIRECTED BY COUMADIN CLINIC 08/20/16   Irish Lack,  Charlann Lange, MD  levothyroxine (SYNTHROID, LEVOTHROID) 50 MCG tablet Take 1 tablet (50 mcg total) by mouth daily. 10/23/15   Magrinat, Virgie Dad, MD  Multiple Vitamin (MULTIVITAMIN WITH MINERALS) TABS Take 1 tablet by mouth daily.    [provider]  pravastatin (PRAVACHOL) 10 MG tablet Take 10 mg by mouth daily.      [provider]  pravastatin (PRAVACHOL) 40 MG tablet Take 40 mg by mouth daily. 11/13/16   [provider]    Family History Family History  Problem Relation Age of Onset  . Cancer Brother        esophagus  . Cancer Mother        breast  . Heart attack Father     Social History Social History  Substance Use Topics  . Smoking status: Never Smoker  . Smokeless tobacco: Never Used  . Alcohol use No     Allergies   Patient has no known  allergies.   Review of Systems Review of Systems  Constitutional: Positive for fatigue. Negative for activity change and fever.  HENT: Negative for congestion.   Respiratory: Negative for shortness of breath.   Cardiovascular: Negative for chest pain.  Gastrointestinal: Negative for abdominal pain.  Neurological: Positive for weakness.  All other systems reviewed and are negative.    Physical Exam Updated Vital Signs BP 132/64   Pulse 69   Temp 97.7 F (36.5 C) (Oral)   Resp 14   SpO2 97%   Physical Exam  Constitutional: She is oriented to person, place, and time. She appears well-developed and well-nourished.  HENT:  Head: Normocephalic and atraumatic.  Mildly dry mucous membranes.  Eyes: Right eye exhibits no discharge.  Cardiovascular: Normal rate, regular rhythm and normal heart sounds.   No murmur heard. Pulmonary/Chest: Effort normal and breath sounds normal. She has no wheezes. She has no rales.  Abdominal: Soft. She exhibits no distension. There is no tenderness.  Neurological: She is oriented to person, place, and time.  Skin: Skin is warm and dry. She is not diaphoretic.  Psychiatric: She has a normal mood and affect.  Nursing note and vitals reviewed.    ED Treatments / Results  Labs (all labs ordered are listed, but only abnormal results are displayed) Labs Reviewed  CBG MONITORING, ED - Abnormal; Notable for the following:       Result Value   Glucose-Capillary 146 (*)    All other components within normal limits  LIPASE, BLOOD  COMPREHENSIVE METABOLIC PANEL  CBC  URINALYSIS, ROUTINE W REFLEX MICROSCOPIC  PROTIME-INR  I-STAT TROPOININ, ED  I-STAT CG4 LACTIC ACID, ED    EKG  EKG Interpretation  Date/Time:  Thursday Nov 27 2016 06:29:27 EDT Ventricular Rate:  67 PR Interval:    QRS Duration: 100 QT Interval:  435 QTC Calculation: 460 R Axis:   60 Text Interpretation:  Atrial fibrillation Low voltage, precordial leads No significant change  since last tracing Confirmed by Buhl, Macy ([id number]) on 11/27/2016 7:02:20 AM       Radiology No results found.  Procedures Procedures (including critical care time)  Medications Ordered in ED Medications  sodium chloride 0.9 % bolus 1,000 mL (not administered)  ondansetron (ZOFRAN) injection 4 mg (4 mg Intravenous Given 11/27/16 0701)     Initial Impression / Assessment and Plan / ED Course  I have reviewed the triage vital signs and the nursing notes.  Pertinent labs & imaging results that were available during my care of the  patient were reviewed by me and considered in my medical decision making (see chart for details).    Well-appearing 83 year old female with history of paroxysmal A. fib on Coumadin presenting today with sudden onset of weakness. Patient unable to give much history except that she just "doesn't feel well". Patient has a nonfocal exam. Neurologically intact. Patient has no focal weakness. No abdominal tenderness.  10:02 AM After fluids and rest patient feels back to baseline. Patient ambulatory without issue. Patient's labs, chest x-ray are all reassuring. Patient's vital signs within normal. Patient feels completely back to her baseline.  Patient has had extensive workup for this dizziness prior, including Holter monitor. We'll encourage follow-up with her cardiologist for further care.  Final Clinical Impressions(s) / ED Diagnoses   Final diagnoses:  None    New Prescriptions New Prescriptions   No medications on file     Macarthur Critchley, MD 11/27/16 1003

## 2016-11-27 NOTE — ED Notes (Signed)
Patient to xray.

## 2016-11-27 NOTE — ED Triage Notes (Signed)
Pt BIB EMS from home. Woke up at 0400 this morning with sudden onset of nausea and dizziness. Pt has prior hx of vertigo, but not recently. Per EMS, pt dizzy throughout but worse when standing. Pt CAOx4 with no neuro deficits. ECG shows a-fib, which pt has hx of.

## 2016-11-27 NOTE — ED Notes (Signed)
Patient ambulated per baseline. C/o slight lightheadness.

## 2016-12-10 DIAGNOSIS — I10 Essential (primary) hypertension: Secondary | ICD-10-CM | POA: Diagnosis not present

## 2016-12-10 DIAGNOSIS — I480 Paroxysmal atrial fibrillation: Secondary | ICD-10-CM | POA: Diagnosis not present

## 2016-12-10 DIAGNOSIS — Z658 Other specified problems related to psychosocial circumstances: Secondary | ICD-10-CM | POA: Diagnosis not present

## 2016-12-10 DIAGNOSIS — R5383 Other fatigue: Secondary | ICD-10-CM | POA: Diagnosis not present

## 2016-12-11 ENCOUNTER — Ambulatory Visit (INDEPENDENT_AMBULATORY_CARE_PROVIDER_SITE_OTHER): Payer: PPO | Admitting: *Deleted

## 2016-12-11 DIAGNOSIS — I4891 Unspecified atrial fibrillation: Secondary | ICD-10-CM

## 2016-12-11 DIAGNOSIS — Z5181 Encounter for therapeutic drug level monitoring: Secondary | ICD-10-CM

## 2016-12-11 DIAGNOSIS — I6350 Cerebral infarction due to unspecified occlusion or stenosis of unspecified cerebral artery: Secondary | ICD-10-CM

## 2016-12-11 LAB — POCT INR: INR: 3.1

## 2016-12-25 ENCOUNTER — Ambulatory Visit
Admission: RE | Admit: 2016-12-25 | Discharge: 2016-12-25 | Disposition: A | Discharge status: Home / Self Care | Payer: PPO | Source: Ambulatory Visit | Attending: Geriatric Medicine | Admitting: Geriatric Medicine

## 2016-12-25 ENCOUNTER — Other Ambulatory Visit: Payer: Self-pay | Admitting: Geriatric Medicine

## 2016-12-25 DIAGNOSIS — M4186 Other forms of scoliosis, lumbar region: Secondary | ICD-10-CM | POA: Diagnosis not present

## 2016-12-25 DIAGNOSIS — H6121 Impacted cerumen, right ear: Secondary | ICD-10-CM | POA: Diagnosis not present

## 2016-12-25 DIAGNOSIS — R5383 Other fatigue: Secondary | ICD-10-CM | POA: Diagnosis not present

## 2016-12-25 DIAGNOSIS — Z0000 Encounter for general adult medical examination without abnormal findings: Secondary | ICD-10-CM

## 2016-12-25 DIAGNOSIS — I10 Essential (primary) hypertension: Secondary | ICD-10-CM | POA: Diagnosis not present

## 2016-12-25 DIAGNOSIS — Z79899 Other long term (current) drug therapy: Secondary | ICD-10-CM | POA: Diagnosis not present

## 2016-12-25 DIAGNOSIS — E7800 Pure hypercholesterolemia, unspecified: Secondary | ICD-10-CM | POA: Diagnosis not present

## 2016-12-25 DIAGNOSIS — I480 Paroxysmal atrial fibrillation: Secondary | ICD-10-CM | POA: Diagnosis not present

## 2016-12-25 DIAGNOSIS — M545 Low back pain: Secondary | ICD-10-CM | POA: Diagnosis not present

## 2016-12-25 DIAGNOSIS — Z1389 Encounter for screening for other disorder: Secondary | ICD-10-CM | POA: Diagnosis not present

## 2016-12-25 DIAGNOSIS — D1721 Benign lipomatous neoplasm of skin and subcutaneous tissue of right arm: Secondary | ICD-10-CM | POA: Diagnosis not present

## 2016-12-25 DIAGNOSIS — E039 Hypothyroidism, unspecified: Secondary | ICD-10-CM | POA: Diagnosis not present

## 2017-01-04 ENCOUNTER — Other Ambulatory Visit: Payer: Self-pay | Admitting: Interventional Cardiology

## 2017-01-08 ENCOUNTER — Ambulatory Visit (INDEPENDENT_AMBULATORY_CARE_PROVIDER_SITE_OTHER): Payer: PPO | Admitting: Pharmacist

## 2017-01-08 DIAGNOSIS — I6350 Cerebral infarction due to unspecified occlusion or stenosis of unspecified cerebral artery: Secondary | ICD-10-CM | POA: Diagnosis not present

## 2017-01-08 DIAGNOSIS — I4891 Unspecified atrial fibrillation: Secondary | ICD-10-CM | POA: Diagnosis not present

## 2017-01-08 DIAGNOSIS — Z5181 Encounter for therapeutic drug level monitoring: Secondary | ICD-10-CM | POA: Diagnosis not present

## 2017-01-08 LAB — POCT INR: INR: 1.5

## 2017-01-29 ENCOUNTER — Ambulatory Visit (INDEPENDENT_AMBULATORY_CARE_PROVIDER_SITE_OTHER): Payer: PPO | Admitting: *Deleted

## 2017-01-29 ENCOUNTER — Encounter (INDEPENDENT_AMBULATORY_CARE_PROVIDER_SITE_OTHER): Payer: Self-pay

## 2017-01-29 DIAGNOSIS — Z5181 Encounter for therapeutic drug level monitoring: Secondary | ICD-10-CM | POA: Diagnosis not present

## 2017-01-29 DIAGNOSIS — I4891 Unspecified atrial fibrillation: Secondary | ICD-10-CM | POA: Diagnosis not present

## 2017-01-29 DIAGNOSIS — I6350 Cerebral infarction due to unspecified occlusion or stenosis of unspecified cerebral artery: Secondary | ICD-10-CM | POA: Diagnosis not present

## 2017-01-29 LAB — POCT INR: INR: 2.1

## 2017-02-05 DIAGNOSIS — J385 Laryngeal spasm: Secondary | ICD-10-CM | POA: Diagnosis not present

## 2017-02-05 DIAGNOSIS — K219 Gastro-esophageal reflux disease without esophagitis: Secondary | ICD-10-CM | POA: Diagnosis not present

## 2017-02-05 DIAGNOSIS — H6123 Impacted cerumen, bilateral: Secondary | ICD-10-CM | POA: Diagnosis not present

## 2017-02-05 DIAGNOSIS — Z8673 Personal history of transient ischemic attack (TIA), and cerebral infarction without residual deficits: Secondary | ICD-10-CM | POA: Diagnosis not present

## 2017-02-06 DIAGNOSIS — J385 Laryngeal spasm: Secondary | ICD-10-CM | POA: Insufficient documentation

## 2017-02-06 DIAGNOSIS — K219 Gastro-esophageal reflux disease without esophagitis: Secondary | ICD-10-CM | POA: Insufficient documentation

## 2017-02-06 DIAGNOSIS — H6123 Impacted cerumen, bilateral: Secondary | ICD-10-CM | POA: Insufficient documentation

## 2017-03-06 ENCOUNTER — Ambulatory Visit (INDEPENDENT_AMBULATORY_CARE_PROVIDER_SITE_OTHER): Payer: PPO | Admitting: *Deleted

## 2017-03-06 DIAGNOSIS — I4891 Unspecified atrial fibrillation: Secondary | ICD-10-CM | POA: Diagnosis not present

## 2017-03-06 DIAGNOSIS — Z5181 Encounter for therapeutic drug level monitoring: Secondary | ICD-10-CM

## 2017-03-06 DIAGNOSIS — I6350 Cerebral infarction due to unspecified occlusion or stenosis of unspecified cerebral artery: Secondary | ICD-10-CM | POA: Diagnosis not present

## 2017-03-06 LAB — POCT INR: INR: 1.4

## 2017-03-20 ENCOUNTER — Ambulatory Visit (INDEPENDENT_AMBULATORY_CARE_PROVIDER_SITE_OTHER): Payer: PPO | Admitting: *Deleted

## 2017-03-20 DIAGNOSIS — G458 Other transient cerebral ischemic attacks and related syndromes: Secondary | ICD-10-CM

## 2017-03-20 DIAGNOSIS — I4891 Unspecified atrial fibrillation: Secondary | ICD-10-CM

## 2017-03-20 DIAGNOSIS — Z5181 Encounter for therapeutic drug level monitoring: Secondary | ICD-10-CM

## 2017-03-20 DIAGNOSIS — I480 Paroxysmal atrial fibrillation: Secondary | ICD-10-CM

## 2017-03-20 DIAGNOSIS — I6350 Cerebral infarction due to unspecified occlusion or stenosis of unspecified cerebral artery: Secondary | ICD-10-CM | POA: Diagnosis not present

## 2017-03-20 LAB — POCT INR: INR: 2.4

## 2017-04-15 DIAGNOSIS — I10 Essential (primary) hypertension: Secondary | ICD-10-CM | POA: Diagnosis not present

## 2017-04-15 DIAGNOSIS — I480 Paroxysmal atrial fibrillation: Secondary | ICD-10-CM | POA: Diagnosis not present

## 2017-04-15 DIAGNOSIS — M810 Age-related osteoporosis without current pathological fracture: Secondary | ICD-10-CM | POA: Diagnosis not present

## 2017-04-15 DIAGNOSIS — E039 Hypothyroidism, unspecified: Secondary | ICD-10-CM | POA: Diagnosis not present

## 2017-04-16 ENCOUNTER — Encounter (INDEPENDENT_AMBULATORY_CARE_PROVIDER_SITE_OTHER): Payer: Self-pay

## 2017-04-16 ENCOUNTER — Ambulatory Visit (INDEPENDENT_AMBULATORY_CARE_PROVIDER_SITE_OTHER): Payer: PPO | Admitting: *Deleted

## 2017-04-16 DIAGNOSIS — Z5181 Encounter for therapeutic drug level monitoring: Secondary | ICD-10-CM | POA: Diagnosis not present

## 2017-04-16 DIAGNOSIS — I480 Paroxysmal atrial fibrillation: Secondary | ICD-10-CM

## 2017-04-16 DIAGNOSIS — I6350 Cerebral infarction due to unspecified occlusion or stenosis of unspecified cerebral artery: Secondary | ICD-10-CM

## 2017-04-16 DIAGNOSIS — I4891 Unspecified atrial fibrillation: Secondary | ICD-10-CM

## 2017-04-16 DIAGNOSIS — G459 Transient cerebral ischemic attack, unspecified: Secondary | ICD-10-CM | POA: Diagnosis not present

## 2017-04-16 LAB — POCT INR: INR: 2.3

## 2017-04-20 ENCOUNTER — Other Ambulatory Visit: Payer: Self-pay | Admitting: *Deleted

## 2017-04-20 DIAGNOSIS — I6523 Occlusion and stenosis of bilateral carotid arteries: Secondary | ICD-10-CM

## 2017-05-06 ENCOUNTER — Encounter: Payer: Self-pay | Admitting: Cardiovascular Disease

## 2017-05-06 ENCOUNTER — Ambulatory Visit: Payer: PPO | Admitting: Cardiovascular Disease

## 2017-05-06 VITALS — BP 136/80 | HR 79 | Resp 16 | Ht 60.0 in | Wt 148.1 lb

## 2017-05-06 DIAGNOSIS — I481 Persistent atrial fibrillation: Secondary | ICD-10-CM | POA: Diagnosis not present

## 2017-05-06 DIAGNOSIS — I10 Essential (primary) hypertension: Secondary | ICD-10-CM

## 2017-05-06 DIAGNOSIS — I4819 Other persistent atrial fibrillation: Secondary | ICD-10-CM

## 2017-05-06 NOTE — Patient Instructions (Signed)
Medication Instructions:  Your physician recommends that you continue on your current medications as directed. Please refer to the Current Medication list given to you today.   Labwork: None Ordered   Testing/Procedures: None Ordered   Follow-Up: Your physician wants you to follow-up in: 6 months with Dr. Nahser.  You will receive a reminder letter in the mail two months in advance. If you don't receive a letter, please call our office to schedule the follow-up appointment.   If you need a refill on your cardiac medications before your next appointment, please call your pharmacy.   Thank you for choosing [facility]! Hikari Tripp, RN [phone]    
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
## 2017-05-14 ENCOUNTER — Ambulatory Visit (INDEPENDENT_AMBULATORY_CARE_PROVIDER_SITE_OTHER): Payer: PPO | Admitting: *Deleted

## 2017-05-14 DIAGNOSIS — Z5181 Encounter for therapeutic drug level monitoring: Secondary | ICD-10-CM

## 2017-05-14 DIAGNOSIS — I6350 Cerebral infarction due to unspecified occlusion or stenosis of unspecified cerebral artery: Secondary | ICD-10-CM

## 2017-05-14 DIAGNOSIS — I4891 Unspecified atrial fibrillation: Secondary | ICD-10-CM | POA: Diagnosis not present

## 2017-05-14 DIAGNOSIS — I480 Paroxysmal atrial fibrillation: Secondary | ICD-10-CM | POA: Diagnosis not present

## 2017-05-14 DIAGNOSIS — G459 Transient cerebral ischemic attack, unspecified: Secondary | ICD-10-CM

## 2017-05-14 LAB — POCT INR: INR: 2.3

## 2017-05-14 NOTE — Patient Instructions (Signed)
Continue same dosage 1 tablet on all days except 1.5 tablets on Mondays, Wednesdays, and Fridays.  Recheck INR in 5 weeks.

## 2017-05-20 ENCOUNTER — Other Ambulatory Visit: Payer: Self-pay | Admitting: *Deleted

## 2017-05-20 MED ORDER — WARFARIN SODIUM 5 MG PO TABS: ORAL_TABLET | ORAL | 3 refills | Status: DC

## 2017-06-18 ENCOUNTER — Other Ambulatory Visit: Payer: Self-pay | Admitting: Cardiovascular Disease

## 2017-07-02 ENCOUNTER — Ambulatory Visit (INDEPENDENT_AMBULATORY_CARE_PROVIDER_SITE_OTHER): Payer: PPO | Admitting: *Deleted

## 2017-07-02 DIAGNOSIS — I6350 Cerebral infarction due to unspecified occlusion or stenosis of unspecified cerebral artery: Secondary | ICD-10-CM

## 2017-07-02 DIAGNOSIS — Z5181 Encounter for therapeutic drug level monitoring: Secondary | ICD-10-CM | POA: Diagnosis not present

## 2017-07-02 DIAGNOSIS — I4891 Unspecified atrial fibrillation: Secondary | ICD-10-CM | POA: Diagnosis not present

## 2017-07-02 LAB — POCT INR: INR: 1.8

## 2017-07-02 NOTE — Patient Instructions (Signed)
Description   Today take 1.5 tablets then continue same dosage 1 tablet on all days except 1.5 tablets on Mondays, Wednesdays, and Fridays.  Recheck INR in 4 weeks.

## 2017-07-30 ENCOUNTER — Ambulatory Visit (INDEPENDENT_AMBULATORY_CARE_PROVIDER_SITE_OTHER): Payer: PPO | Admitting: *Deleted

## 2017-07-30 DIAGNOSIS — I4891 Unspecified atrial fibrillation: Secondary | ICD-10-CM

## 2017-07-30 DIAGNOSIS — I6350 Cerebral infarction due to unspecified occlusion or stenosis of unspecified cerebral artery: Secondary | ICD-10-CM

## 2017-07-30 DIAGNOSIS — Z5181 Encounter for therapeutic drug level monitoring: Secondary | ICD-10-CM | POA: Diagnosis not present

## 2017-07-30 LAB — POCT INR: INR: 1.7

## 2017-07-30 NOTE — Patient Instructions (Signed)
Description   Today take 1.5 tablets then change your dosage to 1.5 tablets everyday  except 1 tablet on Tuesdays, Thursdays and Saturdays.   Recheck INR in 2 weeks.

## 2017-08-13 ENCOUNTER — Ambulatory Visit (INDEPENDENT_AMBULATORY_CARE_PROVIDER_SITE_OTHER): Payer: PPO | Admitting: Pharmacist

## 2017-08-13 DIAGNOSIS — I6350 Cerebral infarction due to unspecified occlusion or stenosis of unspecified cerebral artery: Secondary | ICD-10-CM

## 2017-08-13 DIAGNOSIS — Z5181 Encounter for therapeutic drug level monitoring: Secondary | ICD-10-CM

## 2017-08-13 DIAGNOSIS — I4891 Unspecified atrial fibrillation: Secondary | ICD-10-CM

## 2017-08-13 LAB — POCT INR: INR: 2.5

## 2017-08-13 NOTE — Patient Instructions (Signed)
Description   Continue same dosage 1.5 tablets everyday  except 1 tablet on Tuesdays, Thursdays and Saturdays.   Recheck INR in 3 weeks.

## 2017-08-26 ENCOUNTER — Ambulatory Visit (HOSPITAL_COMMUNITY)
Admission: RE | Admit: 2017-08-26 | Discharge: 2017-08-26 | Disposition: A | Discharge status: Home / Self Care | Payer: PPO | Source: Ambulatory Visit | Attending: Cardiology | Admitting: Cardiology

## 2017-08-26 DIAGNOSIS — I6523 Occlusion and stenosis of bilateral carotid arteries: Secondary | ICD-10-CM | POA: Diagnosis not present

## 2017-08-28 DIAGNOSIS — Z1231 Encounter for screening mammogram for malignant neoplasm of breast: Secondary | ICD-10-CM | POA: Diagnosis not present

## 2017-08-28 DIAGNOSIS — Z853 Personal history of malignant neoplasm of breast: Secondary | ICD-10-CM | POA: Diagnosis not present

## 2017-09-03 ENCOUNTER — Ambulatory Visit (INDEPENDENT_AMBULATORY_CARE_PROVIDER_SITE_OTHER): Payer: PPO | Admitting: *Deleted

## 2017-09-03 DIAGNOSIS — I4891 Unspecified atrial fibrillation: Secondary | ICD-10-CM | POA: Diagnosis not present

## 2017-09-03 DIAGNOSIS — I6350 Cerebral infarction due to unspecified occlusion or stenosis of unspecified cerebral artery: Secondary | ICD-10-CM

## 2017-09-03 DIAGNOSIS — Z5181 Encounter for therapeutic drug level monitoring: Secondary | ICD-10-CM | POA: Diagnosis not present

## 2017-09-03 LAB — POCT INR: INR: 2.9

## 2017-09-03 NOTE — Patient Instructions (Signed)
Description   Continue same dosage 1.5 tablets everyday  except 1 tablet on Tuesdays, Thursdays and Saturdays.   Recheck INR in 4 weeks.

## 2017-09-24 ENCOUNTER — Other Ambulatory Visit: Payer: Self-pay | Admitting: Cardiovascular Disease

## 2017-10-01 ENCOUNTER — Ambulatory Visit (INDEPENDENT_AMBULATORY_CARE_PROVIDER_SITE_OTHER): Payer: PPO | Admitting: *Deleted

## 2017-10-01 DIAGNOSIS — G459 Transient cerebral ischemic attack, unspecified: Secondary | ICD-10-CM

## 2017-10-01 DIAGNOSIS — I6350 Cerebral infarction due to unspecified occlusion or stenosis of unspecified cerebral artery: Secondary | ICD-10-CM

## 2017-10-01 DIAGNOSIS — Z5181 Encounter for therapeutic drug level monitoring: Secondary | ICD-10-CM

## 2017-10-01 DIAGNOSIS — I4891 Unspecified atrial fibrillation: Secondary | ICD-10-CM | POA: Diagnosis not present

## 2017-10-01 DIAGNOSIS — I480 Paroxysmal atrial fibrillation: Secondary | ICD-10-CM | POA: Diagnosis not present

## 2017-10-01 LAB — POCT INR: INR: 2.1

## 2017-10-01 NOTE — Patient Instructions (Signed)
Description   Continue same dosage 1.5 tablets everyday  except 1 tablet on Tuesdays, Thursdays and Saturdays.   Recheck INR in 4 weeks.

## 2017-10-27 ENCOUNTER — Encounter: Payer: Self-pay | Admitting: Cardiovascular Disease

## 2017-10-29 ENCOUNTER — Ambulatory Visit (INDEPENDENT_AMBULATORY_CARE_PROVIDER_SITE_OTHER): Payer: PPO | Admitting: *Deleted

## 2017-10-29 DIAGNOSIS — I480 Paroxysmal atrial fibrillation: Secondary | ICD-10-CM

## 2017-10-29 DIAGNOSIS — G459 Transient cerebral ischemic attack, unspecified: Secondary | ICD-10-CM | POA: Diagnosis not present

## 2017-10-29 DIAGNOSIS — I6350 Cerebral infarction due to unspecified occlusion or stenosis of unspecified cerebral artery: Secondary | ICD-10-CM

## 2017-10-29 DIAGNOSIS — I4891 Unspecified atrial fibrillation: Secondary | ICD-10-CM | POA: Diagnosis not present

## 2017-10-29 DIAGNOSIS — Z5181 Encounter for therapeutic drug level monitoring: Secondary | ICD-10-CM | POA: Diagnosis not present

## 2017-10-29 LAB — POCT INR: INR: 3.3

## 2017-10-29 NOTE — Patient Instructions (Signed)
Description   Do not take coumadin today May 2nd then continue same dosage 1.5 tablets everyday  except 1 tablet on Tuesdays, Thursdays and Saturdays.   Recheck INR in 2 weeks.

## 2017-11-13 ENCOUNTER — Encounter (INDEPENDENT_AMBULATORY_CARE_PROVIDER_SITE_OTHER): Payer: Self-pay

## 2017-11-13 ENCOUNTER — Ambulatory Visit (INDEPENDENT_AMBULATORY_CARE_PROVIDER_SITE_OTHER): Payer: PPO | Admitting: *Deleted

## 2017-11-13 ENCOUNTER — Encounter: Payer: Self-pay | Admitting: Cardiovascular Disease

## 2017-11-13 ENCOUNTER — Ambulatory Visit: Payer: PPO | Admitting: Cardiovascular Disease

## 2017-11-13 VITALS — BP 150/66 | HR 58 | Ht 62.0 in | Wt 149.8 lb

## 2017-11-13 DIAGNOSIS — I6350 Cerebral infarction due to unspecified occlusion or stenosis of unspecified cerebral artery: Secondary | ICD-10-CM | POA: Diagnosis not present

## 2017-11-13 DIAGNOSIS — G459 Transient cerebral ischemic attack, unspecified: Secondary | ICD-10-CM | POA: Diagnosis not present

## 2017-11-13 DIAGNOSIS — I480 Paroxysmal atrial fibrillation: Secondary | ICD-10-CM

## 2017-11-13 DIAGNOSIS — I4891 Unspecified atrial fibrillation: Secondary | ICD-10-CM

## 2017-11-13 DIAGNOSIS — Z5181 Encounter for therapeutic drug level monitoring: Secondary | ICD-10-CM

## 2017-11-13 DIAGNOSIS — I10 Essential (primary) hypertension: Secondary | ICD-10-CM

## 2017-11-13 LAB — POCT INR: INR: 3.2

## 2017-11-13 NOTE — Patient Instructions (Signed)
Description   Do not take coumadin today May 17th then change coumadin dose to  1 tablet everyday  except 1 and 1/2  tablets on Mondays Wednesdays and Fridays.   Recheck INR in 2 weeks.

## 2017-11-13 NOTE — Patient Instructions (Signed)
Your physician recommends that you continue on your current medications as directed. Please refer to the Current Medication list given to you today.  Your physician wants you to follow-up in: 1 year with Dr. Nahser.  You will receive a reminder letter in the mail two months in advance. If you don't receive a letter, please call our office to schedule the follow-up appointment.  
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
## 2017-12-03 ENCOUNTER — Ambulatory Visit: Payer: PPO | Admitting: *Deleted

## 2017-12-03 DIAGNOSIS — I6350 Cerebral infarction due to unspecified occlusion or stenosis of unspecified cerebral artery: Secondary | ICD-10-CM

## 2017-12-03 DIAGNOSIS — G459 Transient cerebral ischemic attack, unspecified: Secondary | ICD-10-CM

## 2017-12-03 DIAGNOSIS — I4891 Unspecified atrial fibrillation: Secondary | ICD-10-CM | POA: Diagnosis not present

## 2017-12-03 DIAGNOSIS — Z5181 Encounter for therapeutic drug level monitoring: Secondary | ICD-10-CM | POA: Diagnosis not present

## 2017-12-03 DIAGNOSIS — I480 Paroxysmal atrial fibrillation: Secondary | ICD-10-CM | POA: Diagnosis not present

## 2017-12-03 LAB — POCT INR: INR: 2.3 (ref 2.0–3.0)

## 2017-12-03 NOTE — Patient Instructions (Signed)
Description   Continue same dose of  coumadin dose   1 tablet everyday  except 1 and 1/2  tablets on Mondays Wednesdays and Fridays.   Recheck INR in 3 weeks.

## 2017-12-28 ENCOUNTER — Ambulatory Visit: Payer: PPO | Admitting: *Deleted

## 2017-12-28 DIAGNOSIS — I6350 Cerebral infarction due to unspecified occlusion or stenosis of unspecified cerebral artery: Secondary | ICD-10-CM

## 2017-12-28 DIAGNOSIS — Z5181 Encounter for therapeutic drug level monitoring: Secondary | ICD-10-CM | POA: Diagnosis not present

## 2017-12-28 DIAGNOSIS — G459 Transient cerebral ischemic attack, unspecified: Secondary | ICD-10-CM

## 2017-12-28 DIAGNOSIS — I480 Paroxysmal atrial fibrillation: Secondary | ICD-10-CM | POA: Diagnosis not present

## 2017-12-28 DIAGNOSIS — I4891 Unspecified atrial fibrillation: Secondary | ICD-10-CM

## 2017-12-28 LAB — POCT INR: INR: 2.7 (ref 2.0–3.0)

## 2017-12-28 NOTE — Patient Instructions (Signed)
Description   Continue same dose of  coumadin dose   1 tablet everyday  except 1 and 1/2  tablets on Mondays Wednesdays and Fridays.   Recheck INR in 4 weeks.

## 2018-01-06 DIAGNOSIS — I10 Essential (primary) hypertension: Secondary | ICD-10-CM | POA: Diagnosis not present

## 2018-01-06 DIAGNOSIS — I480 Paroxysmal atrial fibrillation: Secondary | ICD-10-CM | POA: Diagnosis not present

## 2018-01-06 DIAGNOSIS — Z79899 Other long term (current) drug therapy: Secondary | ICD-10-CM | POA: Diagnosis not present

## 2018-01-06 DIAGNOSIS — M810 Age-related osteoporosis without current pathological fracture: Secondary | ICD-10-CM | POA: Diagnosis not present

## 2018-01-06 DIAGNOSIS — E7800 Pure hypercholesterolemia, unspecified: Secondary | ICD-10-CM | POA: Diagnosis not present

## 2018-01-06 DIAGNOSIS — Z0000 Encounter for general adult medical examination without abnormal findings: Secondary | ICD-10-CM | POA: Diagnosis not present

## 2018-01-06 DIAGNOSIS — I700 Atherosclerosis of aorta: Secondary | ICD-10-CM | POA: Diagnosis not present

## 2018-01-06 DIAGNOSIS — E039 Hypothyroidism, unspecified: Secondary | ICD-10-CM | POA: Diagnosis not present

## 2018-01-06 DIAGNOSIS — Z1389 Encounter for screening for other disorder: Secondary | ICD-10-CM | POA: Diagnosis not present

## 2018-01-28 ENCOUNTER — Encounter (INDEPENDENT_AMBULATORY_CARE_PROVIDER_SITE_OTHER): Payer: Self-pay

## 2018-01-28 ENCOUNTER — Ambulatory Visit: Payer: PPO | Admitting: *Deleted

## 2018-01-28 DIAGNOSIS — Z5181 Encounter for therapeutic drug level monitoring: Secondary | ICD-10-CM

## 2018-01-28 DIAGNOSIS — I4891 Unspecified atrial fibrillation: Secondary | ICD-10-CM | POA: Diagnosis not present

## 2018-01-28 DIAGNOSIS — I6350 Cerebral infarction due to unspecified occlusion or stenosis of unspecified cerebral artery: Secondary | ICD-10-CM | POA: Diagnosis not present

## 2018-01-28 LAB — POCT INR: INR: 3.1 — AB (ref 2.0–3.0)

## 2018-01-28 NOTE — Patient Instructions (Signed)
Description   Today only take 1/2 tablet, then Continue same dose of coumadin dose 1 tablet everyday  except 1 and 1/2  tablets on Mondays,  Wednesdays and Fridays.   Recheck INR in 4 weeks.

## 2018-02-25 ENCOUNTER — Ambulatory Visit: Payer: PPO | Admitting: Pharmacist

## 2018-02-25 DIAGNOSIS — Z5181 Encounter for therapeutic drug level monitoring: Secondary | ICD-10-CM

## 2018-02-25 DIAGNOSIS — I4891 Unspecified atrial fibrillation: Secondary | ICD-10-CM | POA: Diagnosis not present

## 2018-02-25 DIAGNOSIS — I6350 Cerebral infarction due to unspecified occlusion or stenosis of unspecified cerebral artery: Secondary | ICD-10-CM

## 2018-02-25 LAB — POCT INR: INR: 3 (ref 2.0–3.0)

## 2018-02-25 NOTE — Patient Instructions (Signed)
Description   Continue same dose of Coumadin dose 1 tablet every day except 1.5 tablets on Mondays,  Wednesdays and Fridays. Recheck INR in 4 weeks.

## 2018-04-06 ENCOUNTER — Ambulatory Visit: Payer: PPO | Admitting: Pharmacist

## 2018-04-06 DIAGNOSIS — I6350 Cerebral infarction due to unspecified occlusion or stenosis of unspecified cerebral artery: Secondary | ICD-10-CM

## 2018-04-06 DIAGNOSIS — I4891 Unspecified atrial fibrillation: Secondary | ICD-10-CM | POA: Diagnosis not present

## 2018-04-06 DIAGNOSIS — Z5181 Encounter for therapeutic drug level monitoring: Secondary | ICD-10-CM

## 2018-04-06 LAB — POCT INR: INR: 2.4 (ref 2.0–3.0)

## 2018-04-06 NOTE — Patient Instructions (Signed)
Description   Continue same dose of Coumadin dose 1 tablet every day except 1.5 tablets on Mondays,  Wednesdays and Fridays. Recheck INR in 6 weeks.     

## 2018-04-14 ENCOUNTER — Other Ambulatory Visit: Payer: Self-pay | Admitting: Cardiovascular Disease

## 2018-05-20 ENCOUNTER — Ambulatory Visit: Payer: PPO

## 2018-05-20 DIAGNOSIS — I4891 Unspecified atrial fibrillation: Secondary | ICD-10-CM | POA: Diagnosis not present

## 2018-05-20 DIAGNOSIS — Z5181 Encounter for therapeutic drug level monitoring: Secondary | ICD-10-CM | POA: Diagnosis not present

## 2018-05-20 LAB — POCT INR: INR: 2.5 (ref 2.0–3.0)

## 2018-05-20 NOTE — Patient Instructions (Signed)
Description   Continue same dose of Coumadin dose 1 tablet every day except 1.5 tablets on Mondays,  Wednesdays and Fridays. Recheck INR in 6 weeks.

## 2018-07-01 ENCOUNTER — Ambulatory Visit: Payer: PPO

## 2018-07-01 DIAGNOSIS — Z5181 Encounter for therapeutic drug level monitoring: Secondary | ICD-10-CM

## 2018-07-01 DIAGNOSIS — I4891 Unspecified atrial fibrillation: Secondary | ICD-10-CM

## 2018-07-01 LAB — POCT INR: INR: 3.1 — AB (ref 2.0–3.0)

## 2018-07-01 NOTE — Patient Instructions (Signed)
Description   Take 1/2 tablet today, then resume same dosage 1 tablet every day except 1.5 tablets on Mondays,  Wednesdays and Fridays. Recheck INR in 6 weeks.

## 2018-07-07 DIAGNOSIS — F439 Reaction to severe stress, unspecified: Secondary | ICD-10-CM | POA: Diagnosis not present

## 2018-07-07 DIAGNOSIS — I700 Atherosclerosis of aorta: Secondary | ICD-10-CM | POA: Diagnosis not present

## 2018-07-07 DIAGNOSIS — Z79899 Other long term (current) drug therapy: Secondary | ICD-10-CM | POA: Diagnosis not present

## 2018-07-07 DIAGNOSIS — I480 Paroxysmal atrial fibrillation: Secondary | ICD-10-CM | POA: Diagnosis not present

## 2018-07-07 DIAGNOSIS — R252 Cramp and spasm: Secondary | ICD-10-CM | POA: Diagnosis not present

## 2018-07-07 DIAGNOSIS — I10 Essential (primary) hypertension: Secondary | ICD-10-CM | POA: Diagnosis not present

## 2018-07-26 ENCOUNTER — Other Ambulatory Visit: Payer: Self-pay | Admitting: Cardiovascular Disease

## 2018-07-30 DIAGNOSIS — E039 Hypothyroidism, unspecified: Secondary | ICD-10-CM | POA: Diagnosis not present

## 2018-07-30 DIAGNOSIS — I10 Essential (primary) hypertension: Secondary | ICD-10-CM | POA: Diagnosis not present

## 2018-07-30 DIAGNOSIS — M810 Age-related osteoporosis without current pathological fracture: Secondary | ICD-10-CM | POA: Diagnosis not present

## 2018-07-30 DIAGNOSIS — I480 Paroxysmal atrial fibrillation: Secondary | ICD-10-CM | POA: Diagnosis not present

## 2018-08-18 ENCOUNTER — Ambulatory Visit (INDEPENDENT_AMBULATORY_CARE_PROVIDER_SITE_OTHER): Payer: PPO | Admitting: Pharmacist

## 2018-08-18 ENCOUNTER — Encounter (INDEPENDENT_AMBULATORY_CARE_PROVIDER_SITE_OTHER): Payer: Self-pay

## 2018-08-18 DIAGNOSIS — Z5181 Encounter for therapeutic drug level monitoring: Secondary | ICD-10-CM | POA: Diagnosis not present

## 2018-08-18 DIAGNOSIS — I4891 Unspecified atrial fibrillation: Secondary | ICD-10-CM

## 2018-08-18 LAB — POCT INR: INR: 3 (ref 2.0–3.0)

## 2018-08-18 NOTE — Patient Instructions (Signed)
Description   Continue taking 1 tablet every day except 1.5 tablets on Mondays, Wednesdays and Fridays. Recheck INR in 6 weeks.

## 2018-09-07 DIAGNOSIS — Z1231 Encounter for screening mammogram for malignant neoplasm of breast: Secondary | ICD-10-CM | POA: Diagnosis not present

## 2018-09-07 DIAGNOSIS — Z853 Personal history of malignant neoplasm of breast: Secondary | ICD-10-CM | POA: Diagnosis not present

## 2018-09-15 DIAGNOSIS — I480 Paroxysmal atrial fibrillation: Secondary | ICD-10-CM | POA: Diagnosis not present

## 2018-09-15 DIAGNOSIS — I10 Essential (primary) hypertension: Secondary | ICD-10-CM | POA: Diagnosis not present

## 2018-09-15 DIAGNOSIS — E039 Hypothyroidism, unspecified: Secondary | ICD-10-CM | POA: Diagnosis not present

## 2018-09-15 DIAGNOSIS — M810 Age-related osteoporosis without current pathological fracture: Secondary | ICD-10-CM | POA: Diagnosis not present

## 2018-09-28 ENCOUNTER — Telehealth: Payer: Self-pay

## 2018-09-28 NOTE — Telephone Encounter (Signed)

## 2018-09-29 ENCOUNTER — Other Ambulatory Visit: Payer: Self-pay

## 2018-09-29 ENCOUNTER — Ambulatory Visit (INDEPENDENT_AMBULATORY_CARE_PROVIDER_SITE_OTHER): Payer: PPO | Admitting: *Deleted

## 2018-09-29 DIAGNOSIS — I700 Atherosclerosis of aorta: Secondary | ICD-10-CM | POA: Diagnosis not present

## 2018-09-29 DIAGNOSIS — I10 Essential (primary) hypertension: Secondary | ICD-10-CM | POA: Diagnosis not present

## 2018-09-29 DIAGNOSIS — Z79899 Other long term (current) drug therapy: Secondary | ICD-10-CM | POA: Diagnosis not present

## 2018-09-29 DIAGNOSIS — E559 Vitamin D deficiency, unspecified: Secondary | ICD-10-CM | POA: Diagnosis not present

## 2018-09-29 DIAGNOSIS — Z5181 Encounter for therapeutic drug level monitoring: Secondary | ICD-10-CM | POA: Diagnosis not present

## 2018-09-29 DIAGNOSIS — J410 Simple chronic bronchitis: Secondary | ICD-10-CM | POA: Diagnosis not present

## 2018-09-29 DIAGNOSIS — J432 Centrilobular emphysema: Secondary | ICD-10-CM | POA: Diagnosis not present

## 2018-09-29 DIAGNOSIS — I4891 Unspecified atrial fibrillation: Secondary | ICD-10-CM

## 2018-09-29 DIAGNOSIS — E782 Mixed hyperlipidemia: Secondary | ICD-10-CM | POA: Diagnosis not present

## 2018-09-29 DIAGNOSIS — R7309 Other abnormal glucose: Secondary | ICD-10-CM | POA: Diagnosis not present

## 2018-09-29 DIAGNOSIS — K219 Gastro-esophageal reflux disease without esophagitis: Secondary | ICD-10-CM | POA: Diagnosis not present

## 2018-09-29 DIAGNOSIS — Z122 Encounter for screening for malignant neoplasm of respiratory organs: Secondary | ICD-10-CM | POA: Diagnosis not present

## 2018-09-29 DIAGNOSIS — F17200 Nicotine dependence, unspecified, uncomplicated: Secondary | ICD-10-CM | POA: Diagnosis not present

## 2018-09-29 DIAGNOSIS — F419 Anxiety disorder, unspecified: Secondary | ICD-10-CM | POA: Diagnosis not present

## 2018-09-29 LAB — POCT INR: INR: 3.2 — AB (ref 2.0–3.0)

## 2018-10-27 DIAGNOSIS — M25571 Pain in right ankle and joints of right foot: Secondary | ICD-10-CM | POA: Diagnosis not present

## 2018-11-03 DIAGNOSIS — M25571 Pain in right ankle and joints of right foot: Secondary | ICD-10-CM | POA: Diagnosis not present

## 2018-11-09 ENCOUNTER — Telehealth: Payer: Self-pay

## 2018-11-09 NOTE — Telephone Encounter (Signed)
UNABLE TO LMOM FOR PRESCREEN  

## 2018-11-10 ENCOUNTER — Ambulatory Visit (INDEPENDENT_AMBULATORY_CARE_PROVIDER_SITE_OTHER): Payer: PPO | Admitting: Pharmacist

## 2018-11-10 ENCOUNTER — Other Ambulatory Visit: Payer: Self-pay

## 2018-11-10 DIAGNOSIS — I4891 Unspecified atrial fibrillation: Secondary | ICD-10-CM | POA: Diagnosis not present

## 2018-11-10 DIAGNOSIS — Z5181 Encounter for therapeutic drug level monitoring: Secondary | ICD-10-CM

## 2018-11-10 LAB — POCT INR: INR: 3.9 — AB (ref 2.0–3.0)

## 2018-11-10 NOTE — Patient Instructions (Signed)
Description   Spoke with pt and instructed pt to hold tonight's dose then start taking 1 tablet every day except 1.5 tablets on Mondays and Fridays. Recheck INR in 3 weeks.

## 2018-11-17 ENCOUNTER — Telehealth: Payer: Self-pay

## 2018-11-17 NOTE — Telephone Encounter (Signed)
Spoke with pt and she denies any complications currently. She would rather have her appt rescheduled to August. Advised pt if any issues arise to give [place] a call.

## 2018-11-23 ENCOUNTER — Ambulatory Visit: Payer: PPO | Admitting: Cardiovascular Disease

## 2018-11-26 ENCOUNTER — Telehealth: Payer: Self-pay | Admitting: Pharmacist

## 2018-11-26 NOTE — Telephone Encounter (Signed)
1. COVID-19 Pre-Screening Questions:   . In the past 7 to 10 days have you had a cough,  shortness of breath, headache, congestion, fever (100 or greater) body aches, chills, sore throat, or sudden loss of taste or sense of smell? no . Have you been around anyone with known Covid 19. no . Have you been around anyone who is awaiting Covid 19 test results in the past 7 to 10 days? No  . Have you been around anyone who has been exposed to Covid 19, or has mentioned symptoms of Covid 19 within the past 7 to 10 days. No   2. Pt advised of visitor restrictions (no visitors allowed except if needed to conduct the visit). Also advised to arrive at appointment time and wear a mask.   3. Patient is aware that upcoming appointment has been changed to be in office

## 2018-12-01 ENCOUNTER — Ambulatory Visit (INDEPENDENT_AMBULATORY_CARE_PROVIDER_SITE_OTHER): Payer: PPO | Admitting: *Deleted

## 2018-12-01 ENCOUNTER — Other Ambulatory Visit: Payer: Self-pay

## 2018-12-01 DIAGNOSIS — I4891 Unspecified atrial fibrillation: Secondary | ICD-10-CM

## 2018-12-01 DIAGNOSIS — Z5181 Encounter for therapeutic drug level monitoring: Secondary | ICD-10-CM

## 2018-12-01 LAB — POCT INR: INR: 3.2 — AB (ref 2.0–3.0)

## 2018-12-01 NOTE — Patient Instructions (Signed)
Description   Do not take any Coumadin today then start taking 1 tablet every day except 1.5 tablets on Mondays. Recheck INR in 3 weeks.

## 2018-12-15 ENCOUNTER — Telehealth: Payer: Self-pay

## 2018-12-15 NOTE — Telephone Encounter (Signed)
Attempted to call pt to screen but once the number was dialed it said press 1 to connect the call the some random beeping noises so I hung up. Unable to lmom for prescreen

## 2018-12-22 ENCOUNTER — Other Ambulatory Visit: Payer: Self-pay

## 2018-12-22 ENCOUNTER — Ambulatory Visit (INDEPENDENT_AMBULATORY_CARE_PROVIDER_SITE_OTHER): Payer: PPO | Admitting: *Deleted

## 2018-12-22 DIAGNOSIS — I4891 Unspecified atrial fibrillation: Secondary | ICD-10-CM | POA: Diagnosis not present

## 2018-12-22 DIAGNOSIS — Z5181 Encounter for therapeutic drug level monitoring: Secondary | ICD-10-CM | POA: Diagnosis not present

## 2018-12-22 LAB — POCT INR: INR: 2.4 (ref 2.0–3.0)

## 2018-12-22 NOTE — Patient Instructions (Signed)
Description   Continue taking 1 tablet every day except 1.5 tablets on Mondays. Recheck INR in 4 weeks. [facility] [phone]

## 2019-01-05 ENCOUNTER — Ambulatory Visit (INDEPENDENT_AMBULATORY_CARE_PROVIDER_SITE_OTHER): Payer: PPO | Admitting: Family Medicine

## 2019-01-05 ENCOUNTER — Other Ambulatory Visit: Payer: Self-pay

## 2019-01-05 VITALS — BP 128/60 | Ht 62.0 in | Wt 143.0 lb

## 2019-01-05 DIAGNOSIS — M7741 Metatarsalgia, right foot: Secondary | ICD-10-CM

## 2019-01-05 DIAGNOSIS — M79672 Pain in left foot: Secondary | ICD-10-CM

## 2019-01-05 DIAGNOSIS — M79671 Pain in right foot: Secondary | ICD-10-CM | POA: Diagnosis not present

## 2019-01-05 DIAGNOSIS — M7742 Metatarsalgia, left foot: Secondary | ICD-10-CM

## 2019-01-06 ENCOUNTER — Encounter: Payer: Self-pay | Admitting: Family Medicine

## 2019-01-06 NOTE — Progress Notes (Signed)
PCP: Lajean Manes, MD Consultation requested by: Edmonia Lynch MD  Subjective:   HPI: Patient is a 85 y.o. female here for custom orthotics.  Patient reports bilateral foot pain worse on the right dating back to end of April. Seen by Dr. Percell Miller and dx metatarsalgia with right second MTP synovitis.  She tried metatarsal pad but this made pain worse. She's added some cotton to cushion forefoot and this has helped with her pain. Pain sharp at about 5/10 level, worse with walking. She likes to walk 5 miles a day but difficulty doing so now due to this. Difficulty standing.  Past Medical History:  Diagnosis Date  . Arrhythmia    PAF  . Atrial fibrillation ([place])   . Breast CA ([place]) 08/06/10   R lumpectomy  . Breast cancer ([place]) 06/19/10 biopsy    right, inv mammary, ER/PR +, hER2 -  . Breast lump   . Bruises easily   . Cancer ([place])   . Dyslipidemia   . Heart disease   . Hx of radiation therapy 09/04/10 to 10/02/10   R breast  . Hypercholesterolemia   . Hypertension   . Hypothyroidism   . Incontinence   . Osteopenia   . Stroke ([place]) 03/18/03  . Thyroid disease   . Wears glasses     Current Outpatient Medications on File Prior to Visit  Medication Sig Dispense Refill  . acetaminophen (TYLENOL) 325 MG tablet Take 650 mg by mouth 2 (two) times a week. As needed for pain    . alendronate (FOSAMAX) 70 MG tablet TAKE 1 TABLET BY MOUTH WEEKLY AS DIRECTED 12 tablet PRN  . amLODipine (NORVASC) 5 MG tablet Take 5 mg by mouth daily.     . calcium-vitamin D (OSCAL WITH D) 500-200 MG-UNIT per tablet Take 1 tablet by mouth daily.      . hydrochlorothiazide (HYDRODIURIL) 25 MG tablet     . levothyroxine (SYNTHROID, LEVOTHROID) 50 MCG tablet Take 1 tablet (50 mcg total) by mouth daily. 90 tablet 3  . Multiple Vitamin (MULTIVITAMIN WITH MINERALS) TABS Take 1 tablet by mouth daily.    . pravastatin (PRAVACHOL) 40 MG tablet Take 40 mg by mouth daily.    . traMADol (ULTRAM) 50 MG tablet Take 50  mg by mouth as needed for moderate pain.     Marland Kitchen warfarin (JANTOVEN) 5 MG tablet TAKE AS INSTRUCTED BY COUMADIN CLINIC 120 tablet 1   No current facility-administered medications on file prior to visit.     Past Surgical History:  Procedure Laterality Date  . ABDOMINAL HYSTERECTOMY     unilat bso  . APPENDECTOMY    . BREAST LUMPECTOMY  06/2008  . BREAST LUMPECTOMY  08/06/2010   R, INV LOBULAR, DCIS, ER/PR +, HER2-  . CATARACT EXTRACTION, BILATERAL    . OVARIAN CYST SURGERY    . REPLACEMENT TOTAL KNEE BILATERAL  08/12/10  . TONSILLECTOMY AND ADENOIDECTOMY      No Known Allergies  Social History   Socioeconomic History  . Marital status: Married    Spouse name: Not on file  . Number of children: Not on file  . Years of education: Not on file  . Highest education level: Not on file  Occupational History  . Not on file  Social Needs  . Financial resource strain: Not on file  . Food insecurity    Worry: Not on file    Inability: Not on file  . Transportation needs    Medical: Not on file  Non-medical: Not on file  Tobacco Use  . Smoking status: Never Smoker  . Smokeless tobacco: Never Used  Substance and Sexual Activity  . Alcohol use: No  . Drug use: No  . Sexual activity: Not on file    Comment: menarche 39, fist preg age 71, hysterectomy '62, no HRT  Lifestyle  . Physical activity    Days per week: Not on file    Minutes per session: Not on file  . Stress: Not on file  Relationships  . Social Herbalist on phone: Not on file    Gets together: Not on file    Attends religious service: Not on file    Active member of club or organization: Not on file    Attends meetings of clubs or organizations: Not on file    Relationship status: Not on file  . Intimate partner violence    Fear of current or ex partner: Not on file    Emotionally abused: Not on file    Physically abused: Not on file    Forced sexual activity: Not on file  Other Topics Concern  .  Not on file  Social History Narrative  . Not on file    Family History  Problem Relation Age of Onset  . Cancer Brother        esophagus  . Cancer Mother        breast  . Heart attack Father     BP 128/60   Ht _0  (1.575 m)   Wt 143 lb (64.9 kg)   BMI 26.16 kg/m   Review of Systems: See HPI above.     Objective:  Physical Exam:  Gen: NAD, comfortable in exam room  Right foot/ankle: Neutral long arches.  Transverse arch collapse with medial deviation of digits.  Bunionette.  No hallux valgus.  No other gross deformity, swelling, ecchymoses FROM ankle with 5/5 strength.  Minimal decreased motion 1st MTP dorsiflexion TTP 1st and 2nd MTPs and metatarsal heads. Negative ant drawer and talar tilt.   Thompsons test negative. NV intact distally.  Left foot/ankle: Neutral long arches.  Transverse arch collapse.  Bunionette.  No hallux valgus.  No other gross deformity, swelling, ecchymoses FROM ankle with 5/5 strength.  Minimal decreased motion 1st MTP dorsiflexion No TTP. Negative ant drawer and talar tilt.   Thompsons test negative. NV intact distally.   Assessment & Plan:  1. Bilateral foot pain - worse on right consistent with metatarsalgia as primary diagnosis, mild arthritis with synovitis of MTP joints 1st and 2nd digits.  Did not tolerate metatarsal pads.  Custom orthotics made today and felt comfortable to patient.  Discussed what to prompt call for adjustment to these.  Otherwise f/u prn.  Patient was fitted for a : standard, cushioned, semi-rigid orthotic. The orthotic was heated and afterward the patient stood on the orthotic blank positioned on the orthotic stand. The patient was positioned in subtalar neutral position and 10 degrees of ankle dorsiflexion in a weight bearing stance. After completion of molding, a stable base was applied to the orthotic blank. The blank was ground to a stable position for weight bearing. Size: 7 red cambray Base: blue med  density eva Posting: none Additional orthotic padding: none  Total prep time 40 minutes - greater than 50% of which spent on counseling, answering questions related to orthotic wear, foot pain.

## 2019-01-17 DIAGNOSIS — Z0000 Encounter for general adult medical examination without abnormal findings: Secondary | ICD-10-CM | POA: Diagnosis not present

## 2019-01-17 DIAGNOSIS — E039 Hypothyroidism, unspecified: Secondary | ICD-10-CM | POA: Diagnosis not present

## 2019-01-17 DIAGNOSIS — R55 Syncope and collapse: Secondary | ICD-10-CM | POA: Diagnosis not present

## 2019-01-17 DIAGNOSIS — Z1389 Encounter for screening for other disorder: Secondary | ICD-10-CM | POA: Diagnosis not present

## 2019-01-17 DIAGNOSIS — E7800 Pure hypercholesterolemia, unspecified: Secondary | ICD-10-CM | POA: Diagnosis not present

## 2019-01-17 DIAGNOSIS — I10 Essential (primary) hypertension: Secondary | ICD-10-CM | POA: Diagnosis not present

## 2019-01-17 DIAGNOSIS — I700 Atherosclerosis of aorta: Secondary | ICD-10-CM | POA: Diagnosis not present

## 2019-01-17 DIAGNOSIS — I480 Paroxysmal atrial fibrillation: Secondary | ICD-10-CM | POA: Diagnosis not present

## 2019-01-17 DIAGNOSIS — R7309 Other abnormal glucose: Secondary | ICD-10-CM | POA: Diagnosis not present

## 2019-01-17 DIAGNOSIS — M810 Age-related osteoporosis without current pathological fracture: Secondary | ICD-10-CM | POA: Diagnosis not present

## 2019-01-17 DIAGNOSIS — Z79899 Other long term (current) drug therapy: Secondary | ICD-10-CM | POA: Diagnosis not present

## 2019-01-18 ENCOUNTER — Telehealth: Payer: Self-pay

## 2019-01-18 DIAGNOSIS — I10 Essential (primary) hypertension: Secondary | ICD-10-CM | POA: Diagnosis not present

## 2019-01-18 NOTE — Telephone Encounter (Signed)
lmom for prescreen  

## 2019-01-20 ENCOUNTER — Other Ambulatory Visit: Payer: Self-pay

## 2019-01-20 ENCOUNTER — Ambulatory Visit (INDEPENDENT_AMBULATORY_CARE_PROVIDER_SITE_OTHER): Payer: PPO | Admitting: *Deleted

## 2019-01-20 DIAGNOSIS — I4891 Unspecified atrial fibrillation: Secondary | ICD-10-CM | POA: Diagnosis not present

## 2019-01-20 DIAGNOSIS — Z5181 Encounter for therapeutic drug level monitoring: Secondary | ICD-10-CM

## 2019-01-20 LAB — POCT INR: INR: 2.2 (ref 2.0–3.0)

## 2019-01-20 NOTE — Patient Instructions (Signed)
Description   Continue taking 1 tablet every day except 1.5 tablets on Mondays. Recheck INR in 5 weeks. [facility] [phone]

## 2019-02-22 ENCOUNTER — Telehealth: Payer: Self-pay | Admitting: Nurse Practitioner

## 2019-02-22 NOTE — Telephone Encounter (Signed)
Called patient to discuss appointment with Dr. Acie Fredrickson on 8/27. Patient's husband states she will return home later this afternoon. Advised that I will call back later.

## 2019-02-24 ENCOUNTER — Other Ambulatory Visit: Payer: Self-pay

## 2019-02-24 ENCOUNTER — Ambulatory Visit (INDEPENDENT_AMBULATORY_CARE_PROVIDER_SITE_OTHER): Payer: PPO

## 2019-02-24 ENCOUNTER — Encounter: Payer: Self-pay | Admitting: Cardiovascular Disease

## 2019-02-24 ENCOUNTER — Ambulatory Visit: Payer: PPO | Admitting: Cardiovascular Disease

## 2019-02-24 VITALS — BP 142/64 | HR 85 | Ht 62.0 in | Wt 145.8 lb

## 2019-02-24 DIAGNOSIS — I480 Paroxysmal atrial fibrillation: Secondary | ICD-10-CM

## 2019-02-24 DIAGNOSIS — I10 Essential (primary) hypertension: Secondary | ICD-10-CM | POA: Diagnosis not present

## 2019-02-24 DIAGNOSIS — I4891 Unspecified atrial fibrillation: Secondary | ICD-10-CM | POA: Diagnosis not present

## 2019-02-24 DIAGNOSIS — Z5181 Encounter for therapeutic drug level monitoring: Secondary | ICD-10-CM

## 2019-02-24 LAB — POCT INR: INR: 2.5 (ref 2.0–3.0)

## 2019-02-24 MED ORDER — WARFARIN SODIUM 5 MG PO TABS: ORAL_TABLET | ORAL | 1 refills | Status: DC

## 2019-02-24 NOTE — Patient Instructions (Signed)
Description   Continue taking 1 tablet every day except 1.5 tablets on Mondays. Recheck INR in 6 weeks. [facility] [phone]

## 2019-02-24 NOTE — Patient Instructions (Signed)
Medication Instructions:  Your physician recommends that you continue on your current medications as directed. Please refer to the Current Medication list given to you today.  If you need a refill on your cardiac medications before your next appointment, please call your pharmacy.   Lab work: None Ordered    Testing/Procedures: None Ordered   Follow-Up: At [facility], you and your health needs are our priority.  As part of our continuing mission to provide you with exceptional heart care, we have created designated Provider Care Teams.  These Care Teams include your primary Cardiologist (physician) and Advanced Practice Providers (APPs -  Physician Assistants and Nurse Practitioners) who all work together to provide you with the care you need, when you need it. You will need a follow up appointment in:  1 years.  Please call our office 2 months in advance to schedule this appointment.  You may see Dr. Nahser or one of the following Advanced Practice Providers on your designated Care Team: Scott Weaver, PA-C Vin Bhagat, PA-C . Janine Hammond, NP    
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
## 2019-03-21 DIAGNOSIS — I480 Paroxysmal atrial fibrillation: Secondary | ICD-10-CM | POA: Diagnosis not present

## 2019-03-21 DIAGNOSIS — M810 Age-related osteoporosis without current pathological fracture: Secondary | ICD-10-CM | POA: Diagnosis not present

## 2019-03-21 DIAGNOSIS — E039 Hypothyroidism, unspecified: Secondary | ICD-10-CM | POA: Diagnosis not present

## 2019-03-21 DIAGNOSIS — E7800 Pure hypercholesterolemia, unspecified: Secondary | ICD-10-CM | POA: Diagnosis not present

## 2019-03-21 DIAGNOSIS — I10 Essential (primary) hypertension: Secondary | ICD-10-CM | POA: Diagnosis not present

## 2019-04-07 ENCOUNTER — Other Ambulatory Visit: Payer: Self-pay

## 2019-04-07 ENCOUNTER — Encounter (INDEPENDENT_AMBULATORY_CARE_PROVIDER_SITE_OTHER): Payer: Self-pay

## 2019-04-07 ENCOUNTER — Ambulatory Visit (INDEPENDENT_AMBULATORY_CARE_PROVIDER_SITE_OTHER): Payer: PPO

## 2019-04-07 DIAGNOSIS — Z5181 Encounter for therapeutic drug level monitoring: Secondary | ICD-10-CM | POA: Diagnosis not present

## 2019-04-07 DIAGNOSIS — I4891 Unspecified atrial fibrillation: Secondary | ICD-10-CM | POA: Diagnosis not present

## 2019-04-07 LAB — POCT INR: INR: 2.5 (ref 2.0–3.0)

## 2019-04-07 NOTE — Patient Instructions (Signed)
Description   Continue on same dosage 1 tablet every day except 1.5 tablets on Mondays. Recheck INR in 6 weeks. Coumadin Clinic [phone]

## 2019-05-04 ENCOUNTER — Encounter: Payer: Self-pay | Admitting: Podiatry

## 2019-05-04 ENCOUNTER — Ambulatory Visit: Payer: PPO | Admitting: Podiatry

## 2019-05-04 ENCOUNTER — Other Ambulatory Visit: Payer: Self-pay

## 2019-05-04 ENCOUNTER — Ambulatory Visit (INDEPENDENT_AMBULATORY_CARE_PROVIDER_SITE_OTHER): Payer: PPO

## 2019-05-04 VITALS — BP 132/74 | HR 99

## 2019-05-04 DIAGNOSIS — M779 Enthesopathy, unspecified: Secondary | ICD-10-CM | POA: Diagnosis not present

## 2019-05-04 NOTE — Progress Notes (Signed)
Subjective:   Patient ID: [name], female   DOB: 85 y.o.   MRN: [id number]   HPI Patient states she is developed a lot of pain in the right forefoot over the last 4 months and she is tried changing shoes and padding over that period of time.  She was given orthotics in the last couple months but still having problems   Review of Systems  All other systems reviewed and are negative.       Objective:  Physical Exam Vitals signs and nursing note reviewed.  Constitutional:      Appearance: She is well-developed.  Pulmonary:     Effort: Pulmonary effort is normal.  Musculoskeletal: Normal range of motion.  Skin:    General: Skin is warm.  Neurological:     Mental Status: She is alert.     Neurovascular status intact muscle strength found to be adequate range of motion within normal limits with exquisite discomfort third metatarsal phalangeal joint right with inflammation fluid around the joint surface.  There is slight reduction in the plantar fat pad but the pain appears to be more acute     Assessment:  Hopeful acute metatarsal phalangeal joint capsulitis right     Plan:  H&P x-ray reviewed and today I did a block 60 mg like Marcaine mixture and then went ahead and aspirated the third MPJ getting out a small amount of clear fluid injected quarter cc dexamethasone Kenalog and applied thick plantar padding to reduce pressure on the joint surface.  Reappoint to recheck and may have to make changes to orthotics or get a new type of an orthotic  X-ray was negative for indications of fracture or arthritis around the joint surface

## 2019-05-18 DIAGNOSIS — F439 Reaction to severe stress, unspecified: Secondary | ICD-10-CM | POA: Diagnosis not present

## 2019-05-18 DIAGNOSIS — D6869 Other thrombophilia: Secondary | ICD-10-CM | POA: Diagnosis not present

## 2019-05-18 DIAGNOSIS — I10 Essential (primary) hypertension: Secondary | ICD-10-CM | POA: Diagnosis not present

## 2019-05-18 DIAGNOSIS — I480 Paroxysmal atrial fibrillation: Secondary | ICD-10-CM | POA: Diagnosis not present

## 2019-05-19 ENCOUNTER — Ambulatory Visit (INDEPENDENT_AMBULATORY_CARE_PROVIDER_SITE_OTHER): Payer: PPO | Admitting: *Deleted

## 2019-05-19 ENCOUNTER — Other Ambulatory Visit: Payer: Self-pay

## 2019-05-19 DIAGNOSIS — Z5181 Encounter for therapeutic drug level monitoring: Secondary | ICD-10-CM

## 2019-05-19 DIAGNOSIS — I4891 Unspecified atrial fibrillation: Secondary | ICD-10-CM

## 2019-05-19 LAB — POCT INR: INR: 2.6 (ref 2.0–3.0)

## 2019-05-19 NOTE — Patient Instructions (Signed)
Description   Continue on same dosage 1 tablet every day except 1.5 tablets on Mondays. Recheck INR in 6 weeks. Coumadin Clinic [phone]

## 2019-05-25 ENCOUNTER — Encounter: Payer: Self-pay | Admitting: Podiatry

## 2019-05-25 ENCOUNTER — Ambulatory Visit: Payer: PPO | Admitting: Podiatry

## 2019-05-25 ENCOUNTER — Other Ambulatory Visit: Payer: Self-pay

## 2019-05-25 DIAGNOSIS — M779 Enthesopathy, unspecified: Secondary | ICD-10-CM

## 2019-05-25 DIAGNOSIS — D3610 Benign neoplasm of peripheral nerves and autonomic nervous system, unspecified: Secondary | ICD-10-CM | POA: Diagnosis not present

## 2019-05-25 NOTE — Progress Notes (Signed)
Subjective:   Patient ID: [name], female   DOB: 85 y.o.   MRN: [id number]   HPI Patient states she is still having pain and now it seems to be more burning shooting   ROS      Objective:  Physical Exam  No vascular status intact with patient continued to have discomfort around the lesser MPJs right but I did note there is more radiating discomfort second and third intermetatarsal space     Assessment:  Possibility for neuroma-like symptomatology versus inflammatory capsulitis or or metatarsalgia     Plan:  Reviewed both conditions and I did discuss a possible different orthotic but she is already had several orthotics which have not been successful.  I did do sterile prep of each interspace and I injected the second and third interspace with 1.5 cc each of a purified alcohol Marcaine solution I want to see the results in the next 3 weeks

## 2019-06-08 ENCOUNTER — Ambulatory Visit: Payer: PPO | Admitting: Podiatry

## 2019-06-08 ENCOUNTER — Encounter: Payer: Self-pay | Admitting: Podiatry

## 2019-06-08 ENCOUNTER — Other Ambulatory Visit: Payer: Self-pay

## 2019-06-08 DIAGNOSIS — M779 Enthesopathy, unspecified: Secondary | ICD-10-CM

## 2019-06-08 DIAGNOSIS — D3610 Benign neoplasm of peripheral nerves and autonomic nervous system, unspecified: Secondary | ICD-10-CM

## 2019-06-08 NOTE — Progress Notes (Signed)
Subjective:   Patient ID: [name], female   DOB: 85 y.o.   MRN: [id number]   HPI Patient presents stating I seem to be some improved and the padding has helped and also the injections seem to make a difference and I have tried inserts from other people which have not helped   ROS      Objective:  Physical Exam  Neurovascular status intact with patient found to have continued shooting pain between the second and third toe right and slightly between the big toe second toe right foot with also inflammation around the joint surfaces that she is using padding for     Assessment:  Probability for some form of neuroma symptoms or nerve entrapment issues along with inflammatory capsulitis     Plan:  H&P discussed both different conditions and will continue padding currently and I did do sterile prep and injected both the second and a small amount into the first interspace with purified alcohol Marcaine solution and sterile dressing application.  Reappoint 4 weeks or earlier if needed

## 2019-07-06 ENCOUNTER — Other Ambulatory Visit: Payer: Self-pay

## 2019-07-06 ENCOUNTER — Ambulatory Visit (INDEPENDENT_AMBULATORY_CARE_PROVIDER_SITE_OTHER): Payer: PPO | Admitting: Podiatry

## 2019-07-06 ENCOUNTER — Encounter: Payer: Self-pay | Admitting: Podiatry

## 2019-07-06 DIAGNOSIS — G5761 Lesion of plantar nerve, right lower limb: Secondary | ICD-10-CM

## 2019-07-06 DIAGNOSIS — D3610 Benign neoplasm of peripheral nerves and autonomic nervous system, unspecified: Secondary | ICD-10-CM

## 2019-07-07 NOTE — Progress Notes (Signed)
Subjective:   Patient ID: [name], female   DOB: 86 y.o.   MRN: [id number]   HPI Patient presents stating it seems to be feeling somewhat better but I still have pain if I am on it too much.  I am also using the pads anytime I walk   ROS      Objective:  Physical Exam  Neurovascular status remains intact with patient continuing to have discomfort between the first and second second and third toes right with mild discomfort also along the metatarsal phalangeal joints     Assessment:  Probability for some form of nerve impingement compression along with low-grade inflammatory capsulitis metatarsalgia with prominent metatarsals     Plan:  H&P reviewed conditions and explained that I probably will not be able to get her back to complete normal.  At this point I am trying to reduce the nerve impulses and I did do a sterile prep and I injected the second and first interspace with a purified alcohol Marcaine solution to try to reduce nerve impulses.  Reappoint 6 weeks

## 2019-07-12 DIAGNOSIS — M810 Age-related osteoporosis without current pathological fracture: Secondary | ICD-10-CM | POA: Diagnosis not present

## 2019-07-12 DIAGNOSIS — E039 Hypothyroidism, unspecified: Secondary | ICD-10-CM | POA: Diagnosis not present

## 2019-07-12 DIAGNOSIS — E7800 Pure hypercholesterolemia, unspecified: Secondary | ICD-10-CM | POA: Diagnosis not present

## 2019-07-12 DIAGNOSIS — I10 Essential (primary) hypertension: Secondary | ICD-10-CM | POA: Diagnosis not present

## 2019-07-12 DIAGNOSIS — I480 Paroxysmal atrial fibrillation: Secondary | ICD-10-CM | POA: Diagnosis not present

## 2019-07-14 ENCOUNTER — Ambulatory Visit (INDEPENDENT_AMBULATORY_CARE_PROVIDER_SITE_OTHER): Payer: PPO | Admitting: *Deleted

## 2019-07-14 ENCOUNTER — Other Ambulatory Visit: Payer: Self-pay

## 2019-07-14 DIAGNOSIS — I4891 Unspecified atrial fibrillation: Secondary | ICD-10-CM | POA: Diagnosis not present

## 2019-07-14 DIAGNOSIS — Z5181 Encounter for therapeutic drug level monitoring: Secondary | ICD-10-CM

## 2019-07-14 LAB — POCT INR: INR: 2.6 (ref 2.0–3.0)

## 2019-07-14 NOTE — Patient Instructions (Signed)
Description   Continue on same dosage 1 tablet every day except 1.5 tablets on Mondays. Recheck INR in 7 weeks. Coumadin Clinic [phone]

## 2019-07-20 ENCOUNTER — Ambulatory Visit: Payer: PPO | Attending: Internal Medicine

## 2019-07-20 DIAGNOSIS — Z23 Encounter for immunization: Secondary | ICD-10-CM | POA: Insufficient documentation

## 2019-07-20 NOTE — Progress Notes (Signed)
   Covid-19 Vaccination Clinic  Name:  [name]    MRN: [id number] DOB: [birth year]  07/20/2019  [name] was observed post Covid-19 immunization for 15 minutes without incidence. She was provided with Vaccine Information Sheet and instruction to access the V-Safe system.   [name] was instructed to call 911 with any severe reactions post vaccine: Marland Kitchen Difficulty breathing  . Swelling of your face and throat  . A fast heartbeat  . A bad rash all over your body  . Dizziness and weakness    Immunizations Administered    Name Date Dose VIS Date Route   Pfizer COVID-19 Vaccine 07/20/2019  3:36 PM 0.3 mL 06/10/2019 Intramuscular   Manufacturer: [place]   Lot: [id number]   [place]: [id number]

## 2019-08-09 ENCOUNTER — Ambulatory Visit: Payer: PPO | Attending: Internal Medicine

## 2019-08-09 DIAGNOSIS — Z23 Encounter for immunization: Secondary | ICD-10-CM | POA: Insufficient documentation

## 2019-08-09 NOTE — Progress Notes (Signed)
   Covid-19 Vaccination Clinic  Name:  [name]    MRN: [id number] DOB: 08-11-1929  08/09/2019  [name] was observed post Covid-19 immunization for 15 minutes without incidence. She was provided with Vaccine Information Sheet and instruction to access the V-Safe system.   [name] was instructed to call 911 with any severe reactions post vaccine: Marland Kitchen Difficulty breathing  . Swelling of your face and throat  . A fast heartbeat  . A bad rash all over your body  . Dizziness and weakness    Immunizations Administered    Name Date Dose VIS Date Route   Pfizer COVID-19 Vaccine 08/09/2019  1:20 PM 0.3 mL 06/10/2019 Intramuscular   Manufacturer: [place]   Lot: [id number]   [place]: [id number]

## 2019-08-17 ENCOUNTER — Other Ambulatory Visit: Payer: Self-pay

## 2019-08-17 ENCOUNTER — Ambulatory Visit: Payer: PPO | Admitting: Podiatry

## 2019-08-17 ENCOUNTER — Encounter: Payer: Self-pay | Admitting: Podiatry

## 2019-08-17 VITALS — Temp 97.3°F

## 2019-08-17 DIAGNOSIS — G5761 Lesion of plantar nerve, right lower limb: Secondary | ICD-10-CM

## 2019-08-17 DIAGNOSIS — D3610 Benign neoplasm of peripheral nerves and autonomic nervous system, unspecified: Secondary | ICD-10-CM

## 2019-08-17 NOTE — Progress Notes (Signed)
Subjective:   Patient ID: [name], female   DOB: 86 y.o.   MRN: [id number]   HPI Patient presents stating this third interspace my left foot has been bothering but I am doing much better on the other areas   ROS      Objective:  Physical Exam  Neurovascular status intact with the first and second interspace right doing much better with discomfort and pain of the third interspace right with radiating discomfort     Assessment:  Neuroma symptomatology right first and second doing well with third interspace that is reactive currently     Plan:  Viewed condition sterile prep done injected the third interspace with a purified alcohol Marcaine solution for neurolysis type procedure.  Explained wider shoes and reappoint as symptoms indicate and continue to use metatarsal pads

## 2019-09-01 ENCOUNTER — Other Ambulatory Visit: Payer: Self-pay

## 2019-09-01 ENCOUNTER — Ambulatory Visit (INDEPENDENT_AMBULATORY_CARE_PROVIDER_SITE_OTHER): Payer: PPO | Admitting: *Deleted

## 2019-09-01 DIAGNOSIS — Z5181 Encounter for therapeutic drug level monitoring: Secondary | ICD-10-CM | POA: Diagnosis not present

## 2019-09-01 DIAGNOSIS — I4891 Unspecified atrial fibrillation: Secondary | ICD-10-CM | POA: Diagnosis not present

## 2019-09-01 LAB — POCT INR: INR: 2.7 (ref 2.0–3.0)

## 2019-09-01 NOTE — Patient Instructions (Signed)
Description   Continue on same dosage 1 tablet every day except 1.5 tablets on Mondays. Recheck INR in 7 weeks. Coumadin Clinic [phone]

## 2019-10-04 ENCOUNTER — Other Ambulatory Visit: Payer: Self-pay | Admitting: Cardiovascular Disease

## 2019-10-11 DIAGNOSIS — I700 Atherosclerosis of aorta: Secondary | ICD-10-CM | POA: Diagnosis not present

## 2019-10-11 DIAGNOSIS — D6869 Other thrombophilia: Secondary | ICD-10-CM | POA: Diagnosis not present

## 2019-10-11 DIAGNOSIS — F439 Reaction to severe stress, unspecified: Secondary | ICD-10-CM | POA: Diagnosis not present

## 2019-10-11 DIAGNOSIS — I10 Essential (primary) hypertension: Secondary | ICD-10-CM | POA: Diagnosis not present

## 2019-10-11 DIAGNOSIS — Z79899 Other long term (current) drug therapy: Secondary | ICD-10-CM | POA: Diagnosis not present

## 2019-10-11 DIAGNOSIS — E7800 Pure hypercholesterolemia, unspecified: Secondary | ICD-10-CM | POA: Diagnosis not present

## 2019-10-11 DIAGNOSIS — I480 Paroxysmal atrial fibrillation: Secondary | ICD-10-CM | POA: Diagnosis not present

## 2019-10-20 ENCOUNTER — Ambulatory Visit (INDEPENDENT_AMBULATORY_CARE_PROVIDER_SITE_OTHER): Payer: PPO

## 2019-10-20 ENCOUNTER — Other Ambulatory Visit: Payer: Self-pay

## 2019-10-20 ENCOUNTER — Encounter (INDEPENDENT_AMBULATORY_CARE_PROVIDER_SITE_OTHER): Payer: Self-pay

## 2019-10-20 DIAGNOSIS — Z5181 Encounter for therapeutic drug level monitoring: Secondary | ICD-10-CM

## 2019-10-20 DIAGNOSIS — I4891 Unspecified atrial fibrillation: Secondary | ICD-10-CM | POA: Diagnosis not present

## 2019-10-20 LAB — POCT INR: INR: 4 — AB (ref 2.0–3.0)

## 2019-10-20 NOTE — Patient Instructions (Signed)
Description   Skip today's dosage of Coumadin, then take 1/2 tablet tomorrow, then resume same dosage 1 tablet every day except 1.5 tablets on Mondays. Recheck INR in 3 weeks. Coumadin Clinic [phone]

## 2019-11-16 ENCOUNTER — Other Ambulatory Visit: Payer: Self-pay

## 2019-11-16 ENCOUNTER — Ambulatory Visit (INDEPENDENT_AMBULATORY_CARE_PROVIDER_SITE_OTHER): Payer: PPO | Admitting: *Deleted

## 2019-11-16 DIAGNOSIS — I4891 Unspecified atrial fibrillation: Secondary | ICD-10-CM

## 2019-11-16 DIAGNOSIS — Z5181 Encounter for therapeutic drug level monitoring: Secondary | ICD-10-CM | POA: Diagnosis not present

## 2019-11-16 LAB — POCT INR: INR: 3.2 — AB (ref 2.0–3.0)

## 2019-11-16 NOTE — Patient Instructions (Signed)
Description   Today take 1/2 tablet of Warfarin then start taking 1 tablet every day. Recheck INR in 3 weeks. Coumadin Clinic [phone]

## 2019-11-17 DIAGNOSIS — Z1231 Encounter for screening mammogram for malignant neoplasm of breast: Secondary | ICD-10-CM | POA: Diagnosis not present

## 2019-12-12 ENCOUNTER — Other Ambulatory Visit: Payer: Self-pay

## 2019-12-12 ENCOUNTER — Encounter (INDEPENDENT_AMBULATORY_CARE_PROVIDER_SITE_OTHER): Payer: Self-pay

## 2019-12-12 ENCOUNTER — Ambulatory Visit (INDEPENDENT_AMBULATORY_CARE_PROVIDER_SITE_OTHER): Payer: PPO | Admitting: *Deleted

## 2019-12-12 DIAGNOSIS — I4891 Unspecified atrial fibrillation: Secondary | ICD-10-CM | POA: Diagnosis not present

## 2019-12-12 DIAGNOSIS — Z5181 Encounter for therapeutic drug level monitoring: Secondary | ICD-10-CM

## 2019-12-12 LAB — POCT INR: INR: 2.2 (ref 2.0–3.0)

## 2019-12-12 NOTE — Patient Instructions (Signed)
Description   Continue taking 1 tablet every day. Recheck INR in 4 weeks. Coumadin Clinic [phone]

## 2020-01-12 ENCOUNTER — Ambulatory Visit (INDEPENDENT_AMBULATORY_CARE_PROVIDER_SITE_OTHER): Payer: PPO | Admitting: *Deleted

## 2020-01-12 ENCOUNTER — Other Ambulatory Visit: Payer: Self-pay

## 2020-01-12 DIAGNOSIS — Z5181 Encounter for therapeutic drug level monitoring: Secondary | ICD-10-CM | POA: Diagnosis not present

## 2020-01-12 DIAGNOSIS — I4891 Unspecified atrial fibrillation: Secondary | ICD-10-CM | POA: Diagnosis not present

## 2020-01-12 LAB — POCT INR: INR: 2.7 (ref 2.0–3.0)

## 2020-01-12 NOTE — Patient Instructions (Signed)
Description   Continue taking Warfarin 1 tablet everyday. Recheck INR in 4 weeks. Coumadin Clinic [phone]      

## 2020-02-08 DIAGNOSIS — Z0000 Encounter for general adult medical examination without abnormal findings: Secondary | ICD-10-CM | POA: Diagnosis not present

## 2020-02-08 DIAGNOSIS — M810 Age-related osteoporosis without current pathological fracture: Secondary | ICD-10-CM | POA: Diagnosis not present

## 2020-02-08 DIAGNOSIS — E7800 Pure hypercholesterolemia, unspecified: Secondary | ICD-10-CM | POA: Diagnosis not present

## 2020-02-08 DIAGNOSIS — M546 Pain in thoracic spine: Secondary | ICD-10-CM | POA: Diagnosis not present

## 2020-02-08 DIAGNOSIS — I700 Atherosclerosis of aorta: Secondary | ICD-10-CM | POA: Diagnosis not present

## 2020-02-08 DIAGNOSIS — E039 Hypothyroidism, unspecified: Secondary | ICD-10-CM | POA: Diagnosis not present

## 2020-02-08 DIAGNOSIS — Z79899 Other long term (current) drug therapy: Secondary | ICD-10-CM | POA: Diagnosis not present

## 2020-02-08 DIAGNOSIS — G8929 Other chronic pain: Secondary | ICD-10-CM | POA: Diagnosis not present

## 2020-02-08 DIAGNOSIS — I480 Paroxysmal atrial fibrillation: Secondary | ICD-10-CM | POA: Diagnosis not present

## 2020-02-08 DIAGNOSIS — I10 Essential (primary) hypertension: Secondary | ICD-10-CM | POA: Diagnosis not present

## 2020-02-08 DIAGNOSIS — Z1389 Encounter for screening for other disorder: Secondary | ICD-10-CM | POA: Diagnosis not present

## 2020-02-08 DIAGNOSIS — F439 Reaction to severe stress, unspecified: Secondary | ICD-10-CM | POA: Diagnosis not present

## 2020-02-20 ENCOUNTER — Other Ambulatory Visit: Payer: Self-pay

## 2020-02-20 ENCOUNTER — Ambulatory Visit (INDEPENDENT_AMBULATORY_CARE_PROVIDER_SITE_OTHER): Payer: PPO | Admitting: *Deleted

## 2020-02-20 DIAGNOSIS — Z5181 Encounter for therapeutic drug level monitoring: Secondary | ICD-10-CM

## 2020-02-20 DIAGNOSIS — I4891 Unspecified atrial fibrillation: Secondary | ICD-10-CM | POA: Diagnosis not present

## 2020-02-20 LAB — POCT INR: INR: 2.2 (ref 2.0–3.0)

## 2020-02-20 NOTE — Patient Instructions (Signed)
Description   Continue taking Warfarin 1 tablet every day. Recheck INR in 6 weeks. Coumadin Clinic [phone]     

## 2020-04-05 ENCOUNTER — Other Ambulatory Visit: Payer: Self-pay

## 2020-04-05 ENCOUNTER — Ambulatory Visit (INDEPENDENT_AMBULATORY_CARE_PROVIDER_SITE_OTHER): Payer: PPO | Admitting: *Deleted

## 2020-04-05 DIAGNOSIS — Z5181 Encounter for therapeutic drug level monitoring: Secondary | ICD-10-CM

## 2020-04-05 DIAGNOSIS — I4891 Unspecified atrial fibrillation: Secondary | ICD-10-CM | POA: Diagnosis not present

## 2020-04-05 LAB — POCT INR: INR: 2 (ref 2.0–3.0)

## 2020-04-05 NOTE — Patient Instructions (Signed)
Description   Continue taking Warfarin 1 tablet every day. Recheck INR in 6 weeks. Coumadin Clinic [phone]     

## 2020-05-14 ENCOUNTER — Ambulatory Visit
Admission: RE | Admit: 2020-05-14 | Discharge: 2020-05-14 | Disposition: A | Discharge status: Home / Self Care | Payer: PPO | Source: Ambulatory Visit | Attending: Geriatric Medicine | Admitting: Geriatric Medicine

## 2020-05-14 ENCOUNTER — Other Ambulatory Visit: Payer: Self-pay | Admitting: Geriatric Medicine

## 2020-05-14 DIAGNOSIS — I10 Essential (primary) hypertension: Secondary | ICD-10-CM | POA: Diagnosis not present

## 2020-05-14 DIAGNOSIS — M546 Pain in thoracic spine: Secondary | ICD-10-CM

## 2020-05-14 DIAGNOSIS — M47816 Spondylosis without myelopathy or radiculopathy, lumbar region: Secondary | ICD-10-CM | POA: Diagnosis not present

## 2020-05-14 DIAGNOSIS — M47814 Spondylosis without myelopathy or radiculopathy, thoracic region: Secondary | ICD-10-CM | POA: Diagnosis not present

## 2020-05-14 DIAGNOSIS — I480 Paroxysmal atrial fibrillation: Secondary | ICD-10-CM | POA: Diagnosis not present

## 2020-05-14 DIAGNOSIS — M2578 Osteophyte, vertebrae: Secondary | ICD-10-CM | POA: Diagnosis not present

## 2020-05-14 DIAGNOSIS — M4804 Spinal stenosis, thoracic region: Secondary | ICD-10-CM | POA: Diagnosis not present

## 2020-05-17 ENCOUNTER — Other Ambulatory Visit: Payer: Self-pay

## 2020-05-17 ENCOUNTER — Ambulatory Visit (INDEPENDENT_AMBULATORY_CARE_PROVIDER_SITE_OTHER): Payer: PPO | Admitting: *Deleted

## 2020-05-17 DIAGNOSIS — Z5181 Encounter for therapeutic drug level monitoring: Secondary | ICD-10-CM

## 2020-05-17 DIAGNOSIS — I4891 Unspecified atrial fibrillation: Secondary | ICD-10-CM | POA: Diagnosis not present

## 2020-05-17 LAB — POCT INR: INR: 2.9 (ref 2.0–3.0)

## 2020-05-17 NOTE — Patient Instructions (Signed)
Description   Continue taking Warfarin 1 tablet every day. Recheck INR in 6 weeks. Coumadin Clinic [phone]    Coumadin Clinic [phone]

## 2020-05-22 ENCOUNTER — Other Ambulatory Visit: Payer: Self-pay | Admitting: Cardiovascular Disease

## 2020-07-05 ENCOUNTER — Ambulatory Visit (INDEPENDENT_AMBULATORY_CARE_PROVIDER_SITE_OTHER): Payer: PPO | Admitting: *Deleted

## 2020-07-05 ENCOUNTER — Other Ambulatory Visit: Payer: Self-pay

## 2020-07-05 DIAGNOSIS — I4891 Unspecified atrial fibrillation: Secondary | ICD-10-CM | POA: Diagnosis not present

## 2020-07-05 DIAGNOSIS — Z5181 Encounter for therapeutic drug level monitoring: Secondary | ICD-10-CM | POA: Diagnosis not present

## 2020-07-05 LAB — POCT INR: INR: 2.4 (ref 2.0–3.0)

## 2020-07-05 NOTE — Patient Instructions (Signed)
Description   Continue taking Warfarin 1 tablet every day. Recheck INR in 6 weeks. Coumadin Clinic [phone]     

## 2020-07-26 ENCOUNTER — Other Ambulatory Visit (HOSPITAL_COMMUNITY): Payer: Self-pay | Admitting: Cardiovascular Disease

## 2020-07-26 DIAGNOSIS — I6523 Occlusion and stenosis of bilateral carotid arteries: Secondary | ICD-10-CM

## 2020-07-30 ENCOUNTER — Other Ambulatory Visit: Payer: Self-pay

## 2020-07-30 ENCOUNTER — Ambulatory Visit (HOSPITAL_COMMUNITY)
Admission: RE | Admit: 2020-07-30 | Discharge: 2020-07-30 | Disposition: A | Discharge status: Home / Self Care | Payer: PPO | Source: Ambulatory Visit | Attending: Internal Medicine | Admitting: Internal Medicine

## 2020-07-30 DIAGNOSIS — I6523 Occlusion and stenosis of bilateral carotid arteries: Secondary | ICD-10-CM | POA: Insufficient documentation

## 2020-08-15 ENCOUNTER — Other Ambulatory Visit: Payer: Self-pay

## 2020-08-15 ENCOUNTER — Ambulatory Visit: Payer: PPO | Admitting: Podiatry

## 2020-08-15 ENCOUNTER — Encounter: Payer: Self-pay | Admitting: Podiatry

## 2020-08-15 DIAGNOSIS — D3610 Benign neoplasm of peripheral nerves and autonomic nervous system, unspecified: Secondary | ICD-10-CM | POA: Diagnosis not present

## 2020-08-15 DIAGNOSIS — M779 Enthesopathy, unspecified: Secondary | ICD-10-CM

## 2020-08-15 NOTE — Progress Notes (Signed)
Subjective:   Patient ID: [name], female   DOB: 87 y.o.   MRN: [id number]   HPI Patient presents extruding she is getting some pain on the dorsum of her right foot and on her left foot she has been getting some shooting discomforts between the toes.  States that the right foot is pulling and is in a different area   ROS      Objective:  Physical Exam  Neurovascular status intact with inflammation around the first MPJ right with tightness of the extensor tendon and on the left there is shooting pain third interspace with radiating-like discomforts      Assessment:  Neuroma symptomatology left moderate in intensity and extensor tendinitis capsulitis right     Plan:  H&P reviewed both conditions separately.  For the left I went ahead did sterile prep and injected the interspace nerve root with a combination of purified alcohol Marcaine solution and for the right I went ahead discussed rigid bottom shoes stretching exercises and heat therapy.  Patient will be seen back as needed

## 2020-08-17 ENCOUNTER — Other Ambulatory Visit: Payer: Self-pay

## 2020-08-17 ENCOUNTER — Ambulatory Visit (INDEPENDENT_AMBULATORY_CARE_PROVIDER_SITE_OTHER): Payer: PPO | Admitting: *Deleted

## 2020-08-17 DIAGNOSIS — Z5181 Encounter for therapeutic drug level monitoring: Secondary | ICD-10-CM

## 2020-08-17 DIAGNOSIS — I4891 Unspecified atrial fibrillation: Secondary | ICD-10-CM | POA: Diagnosis not present

## 2020-08-17 LAB — POCT INR: INR: 2.5 (ref 2.0–3.0)

## 2020-08-17 NOTE — Patient Instructions (Signed)
Description   Continue taking Warfarin 1 tablet every day. Recheck INR in 5 weeks with MD appt. Coumadin Clinic [phone]

## 2020-09-05 DIAGNOSIS — D6869 Other thrombophilia: Secondary | ICD-10-CM | POA: Diagnosis not present

## 2020-09-05 DIAGNOSIS — I480 Paroxysmal atrial fibrillation: Secondary | ICD-10-CM | POA: Diagnosis not present

## 2020-09-05 DIAGNOSIS — I10 Essential (primary) hypertension: Secondary | ICD-10-CM | POA: Diagnosis not present

## 2020-09-05 DIAGNOSIS — R55 Syncope and collapse: Secondary | ICD-10-CM | POA: Diagnosis not present

## 2020-09-05 DIAGNOSIS — Z79899 Other long term (current) drug therapy: Secondary | ICD-10-CM | POA: Diagnosis not present

## 2020-09-05 DIAGNOSIS — R269 Unspecified abnormalities of gait and mobility: Secondary | ICD-10-CM | POA: Diagnosis not present

## 2020-09-18 ENCOUNTER — Other Ambulatory Visit: Payer: Self-pay

## 2020-09-18 ENCOUNTER — Ambulatory Visit: Payer: PPO | Admitting: Cardiovascular Disease

## 2020-09-18 ENCOUNTER — Ambulatory Visit (INDEPENDENT_AMBULATORY_CARE_PROVIDER_SITE_OTHER): Payer: PPO | Admitting: *Deleted

## 2020-09-18 ENCOUNTER — Encounter: Payer: Self-pay | Admitting: Cardiovascular Disease

## 2020-09-18 VITALS — BP 104/68 | HR 85 | Ht 62.0 in | Wt 134.0 lb

## 2020-09-18 DIAGNOSIS — I480 Paroxysmal atrial fibrillation: Secondary | ICD-10-CM

## 2020-09-18 DIAGNOSIS — Z5181 Encounter for therapeutic drug level monitoring: Secondary | ICD-10-CM

## 2020-09-18 DIAGNOSIS — I10 Essential (primary) hypertension: Secondary | ICD-10-CM | POA: Diagnosis not present

## 2020-09-18 DIAGNOSIS — I4891 Unspecified atrial fibrillation: Secondary | ICD-10-CM | POA: Diagnosis not present

## 2020-09-18 LAB — POCT INR: INR: 2.5 (ref 2.0–3.0)

## 2020-09-18 NOTE — Patient Instructions (Signed)
Medication Instructions:  Your physician has recommended you make the following change in your medication:   STOP Amlodipine  *If you need a refill on your cardiac medications before your next appointment, please call your pharmacy*   Lab Work: none If you have labs (blood work) drawn today and your tests are completely normal, you will receive your results only by: Marland Kitchen MyChart Message (if you have MyChart) OR . A paper copy in the mail If you have any lab test that is abnormal or we need to change your treatment, we will call you to review the results.   Testing/Procedures: none   Follow-Up: At [facility], you and your health needs are our priority.  As part of our continuing mission to provide you with exceptional heart care, we have created designated Provider Care Teams.  These Care Teams include your primary Cardiologist (physician) and Advanced Practice Providers (APPs -  Physician Assistants and Nurse Practitioners) who all work together to provide you with the care you need, when you need it.  We recommend signing up for the patient portal called "MyChart".  Sign up information is provided on this After Visit Summary.  MyChart is used to connect with patients for Virtual Visits (Telemedicine).  Patients are able to view lab/test results, encounter notes, upcoming appointments, etc.  Non-urgent messages can be sent to your provider as well.   To learn more about what you can do with MyChart, go to [email or web address].    Your next appointment:   1 year(s)  The format for your next appointment:   In Person  Provider:   You may see Mertie Moores, MD or one of the following Advanced Practice Providers on your designated Care Team:    Richardson Dopp, PA-C  [place], [place]    Other Instructions You have been referred to see a pharmacist in the Hypertension clinic in 4-6 weeks.

## 2020-09-18 NOTE — Patient Instructions (Signed)
Description   Continue taking Warfarin 1 tablet every day. Recheck INR in 6 weeks. Coumadin Clinic [phone]
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
## 2020-10-18 ENCOUNTER — Ambulatory Visit: Payer: PPO

## 2020-10-21 ENCOUNTER — Other Ambulatory Visit: Payer: Self-pay

## 2020-10-21 ENCOUNTER — Emergency Department (HOSPITAL_COMMUNITY)
Admission: EM | Admit: 2020-10-21 | Discharge: 2020-10-21 | Disposition: A | Discharge status: Home / Self Care | Payer: PPO | Attending: Emergency Medicine | Admitting: Emergency Medicine

## 2020-10-21 ENCOUNTER — Encounter (HOSPITAL_COMMUNITY): Payer: Self-pay | Admitting: Emergency Medicine

## 2020-10-21 ENCOUNTER — Emergency Department (HOSPITAL_COMMUNITY): Payer: PPO

## 2020-10-21 DIAGNOSIS — R0602 Shortness of breath: Secondary | ICD-10-CM | POA: Insufficient documentation

## 2020-10-21 DIAGNOSIS — Z20822 Contact with and (suspected) exposure to covid-19: Secondary | ICD-10-CM | POA: Diagnosis not present

## 2020-10-21 DIAGNOSIS — R079 Chest pain, unspecified: Secondary | ICD-10-CM | POA: Diagnosis not present

## 2020-10-21 DIAGNOSIS — Z853 Personal history of malignant neoplasm of breast: Secondary | ICD-10-CM | POA: Insufficient documentation

## 2020-10-21 DIAGNOSIS — E039 Hypothyroidism, unspecified: Secondary | ICD-10-CM | POA: Insufficient documentation

## 2020-10-21 DIAGNOSIS — Z79899 Other long term (current) drug therapy: Secondary | ICD-10-CM | POA: Diagnosis not present

## 2020-10-21 DIAGNOSIS — R059 Cough, unspecified: Secondary | ICD-10-CM | POA: Diagnosis not present

## 2020-10-21 DIAGNOSIS — J069 Acute upper respiratory infection, unspecified: Secondary | ICD-10-CM | POA: Diagnosis not present

## 2020-10-21 DIAGNOSIS — I10 Essential (primary) hypertension: Secondary | ICD-10-CM | POA: Diagnosis not present

## 2020-10-21 DIAGNOSIS — R531 Weakness: Secondary | ICD-10-CM | POA: Diagnosis not present

## 2020-10-21 DIAGNOSIS — I480 Paroxysmal atrial fibrillation: Secondary | ICD-10-CM | POA: Diagnosis not present

## 2020-10-21 DIAGNOSIS — R5383 Other fatigue: Secondary | ICD-10-CM | POA: Insufficient documentation

## 2020-10-21 LAB — CBC
HCT: 42.7 % (ref 36.0–46.0)
Hemoglobin: 14.5 g/dL (ref 12.0–15.0)
MCH: 30.3 pg (ref 26.0–34.0)
MCHC: 34 g/dL (ref 30.0–36.0)
MCV: 89.3 fL (ref 80.0–100.0)
Platelets: 180 10*3/uL (ref 150–400)
RBC: 4.78 MIL/uL (ref 3.87–5.11)
RDW: 13.3 % (ref 11.5–15.5)
WBC: 4.4 10*3/uL (ref 4.0–10.5)
nRBC: 0 % (ref 0.0–0.2)

## 2020-10-21 LAB — BRAIN NATRIURETIC PEPTIDE: B Natriuretic Peptide: 88.3 pg/mL (ref 0.0–100.0)

## 2020-10-21 LAB — BASIC METABOLIC PANEL
Anion gap: 8 (ref 5–15)
BUN: 9 mg/dL (ref 8–23)
CO2: 28 mmol/L (ref 22–32)
Calcium: 9 mg/dL (ref 8.9–10.3)
Chloride: 105 mmol/L (ref 98–111)
Creatinine, Ser: 0.71 mg/dL (ref 0.44–1.00)
GFR, Estimated: >60 mL/min (ref >60)
Glucose, Bld: 98 mg/dL (ref 70–99)
Potassium: 4 mmol/L (ref 3.5–5.1)
Sodium: 141 mmol/L (ref 135–145)

## 2020-10-21 LAB — PROTIME-INR
INR: 2.3 — ABNORMAL HIGH (ref 0.8–1.2)
Prothrombin Time: 24.9 seconds — ABNORMAL HIGH (ref 11.4–15.2)

## 2020-10-21 LAB — RESP PANEL BY RT-PCR (FLU A&B, COVID) ARPGX2
Influenza A by PCR: NEGATIVE
Influenza B by PCR: NEGATIVE
SARS Coronavirus 2 by RT PCR: NEGATIVE

## 2020-10-21 MED ORDER — BENZONATATE 100 MG PO CAPS: 100.0000 mg | ORAL_CAPSULE | Freq: Three times a day (TID) | ORAL | 0 refills | Status: AC | PRN

## 2020-10-21 MED ORDER — ALBUTEROL SULFATE HFA 108 (90 BASE) MCG/ACT IN AERS: 1.0000 | INHALATION_SPRAY | Freq: Four times a day (QID) | RESPIRATORY_TRACT | 0 refills | Status: AC | PRN

## 2020-10-21 NOTE — Discharge Instructions (Signed)
You appear to have an upper respiratory infection (URI). An upper respiratory tract infection, or cold, is a viral infection of the air passages leading to the lungs. It is contagious and can be spread to others, especially during the first 3 or 4 days. It cannot be cured by antibiotics or other medicines. °RETURN IMMEDIATELY IF you develop shortness of breath, confusion or altered mental status, a new rash, become dizzy, faint, or poorly responsive, or are unable to be cared for at home. ° °

## 2020-10-21 NOTE — ED Provider Notes (Signed)
[facility] DEPT Provider Note   CSN: [id number] Arrival date & time: 10/21/20  1748     History Chief Complaint  Patient presents with  . Shortness of Breath    [name] is a 87 y.o. female who presents emergency department with chief complaint of cough and shortness of breath.  The patient complains of shortness of breath and cough for the past several days.  Her cough is productive care.  She has been bringing up brown and green-colored phlegm.  She feels like at times she is wheezing.  She has had all of her COVID vaccines including her last booster shot 2 weeks ago.  She denies any known fevers, urinary symptoms, nausea, vomiting.  She has no shortness of breath at this time.  She has associated malaise, fatigue and generalized weakness.  She has paroxysmal atrial fibrillation and is on chronic anticoagulation with Coumadin. HPI     Past Medical History:  Diagnosis Date  . Arrhythmia    PAF  . Atrial fibrillation ([place])   . Breast CA ([facility]) 08/06/10   R lumpectomy  . Breast cancer ([facility]) 06/19/10 biopsy    right, inv mammary, ER/PR +, hER2 -  . Breast lump   . Bruises easily   . Cancer ([facility])   . Dyslipidemia   . Heart disease   . Hx of radiation therapy 09/04/10 to 10/02/10   R breast  . Hypercholesterolemia   . Hypertension   . Hypothyroidism   . Incontinence   . Osteopenia   . Stroke ([facility]) 03/18/03  . Thyroid disease   . Wears glasses     Patient Active Problem List   Diagnosis Date Noted  . Bilateral impacted cerumen 02/06/2017  . Gastroesophageal reflux disease 02/06/2017  . Laryngospasm 02/06/2017  . Malignant neoplasm of upper-outer quadrant of left female breast ([facility]) 10/17/2015  . PAF (paroxysmal atrial fibrillation) ([facility]) 07/26/2015  . TIA (transient ischemic attack) 07/26/2015  . Slurred speech 07/26/2015  . Fatigue 07/26/2015  . Essential hypertension 05/23/2014  . Breast cancer of upper-outer quadrant of left female  breast ([facility]) 10/04/2013  . Encounter for therapeutic drug monitoring 07/28/2013  . Unspecified cerebral artery occlusion with cerebral infarction 04/12/2013  . Hx of radiation therapy   . Atrial fibrillation ([facility])   . History of breast cancer, ILC, left UOQ, receptor + Her 2 - 03/14/2011    Past Surgical History:  Procedure Laterality Date  . ABDOMINAL HYSTERECTOMY     unilat bso  . APPENDECTOMY    . BREAST LUMPECTOMY  06/2008  . BREAST LUMPECTOMY  08/06/2010   R, INV LOBULAR, DCIS, ER/PR +, HER2-  . CATARACT EXTRACTION, BILATERAL    . OVARIAN CYST SURGERY    . REPLACEMENT TOTAL KNEE BILATERAL  08/12/10  . TONSILLECTOMY AND ADENOIDECTOMY       OB History   No obstetric history on file.     Family History  Problem Relation Age of Onset  . Cancer Brother        esophagus  . Cancer Mother        breast  . Heart attack Father     Social History   Tobacco Use  . Smoking status: Never Smoker  . Smokeless tobacco: Never Used  Vaping Use  . Vaping Use: Never used  Substance Use Topics  . Alcohol use: No  . Drug use: No    Home Medications Prior to Admission medications   Medication Sig Start Date End  Date Taking? Authorizing Provider  acetaminophen (TYLENOL) 325 MG tablet Take 650 mg by mouth 2 (two) times a week. As needed for pain    [provider]  calcium-vitamin D (OSCAL WITH D) 500-200 MG-UNIT per tablet Take 1 tablet by mouth daily.    [provider]  FLUZONE HIGH-DOSE QUADRIVALENT 0.7 ML SUSY  03/28/19   [provider]  hydrochlorothiazide (HYDRODIURIL) 25 MG tablet Take 25 mg by mouth 3 (three) times a week.  09/23/18   [provider]  levothyroxine (SYNTHROID, LEVOTHROID) 50 MCG tablet Take 1 tablet (50 mcg total) by mouth daily. 10/23/15   Magrinat, Virgie Dad, MD  Multiple Vitamin (MULTIVITAMIN WITH MINERALS) TABS Take 1 tablet by mouth daily.    [provider]  pravastatin (PRAVACHOL) 40 MG tablet Take 40 mg by  mouth daily. 11/13/16   [provider]  traMADol (ULTRAM) 50 MG tablet Take 50 mg by mouth as needed for moderate pain.  04/12/14   [provider]  warfarin (JANTOVEN) 5 MG tablet TAKE AS INSTRUCTED BY COUMADIN CLINIC 05/22/20   Nahser, Wonda Cheng, MD    Allergies    Patient has no known allergies.  Review of Systems   Review of Systems Ten systems reviewed and are negative for acute change, except as noted in the HPI. -Physical Exam Updated Vital Signs BP (!) 161/92 (BP Location: Left Arm)   Pulse 90   Temp 97.7 F (36.5 C) (Oral)   Resp 18   SpO2 100%   Physical Exam Vitals and nursing note reviewed.  Constitutional:      General: She is not in acute distress.    Appearance: She is well-developed. She is ill-appearing. She is not diaphoretic.  HENT:     Head: Normocephalic and atraumatic.  Eyes:     General: No scleral icterus.    Conjunctiva/sclera: Conjunctivae normal.  Cardiovascular:     Rate and Rhythm: Normal rate and regular rhythm.     Heart sounds: Normal heart sounds. No murmur heard. No friction rub. No gallop.   Pulmonary:     Effort: Pulmonary effort is normal. No respiratory distress.     Breath sounds: Rhonchi present. No wheezing.  Abdominal:     General: Bowel sounds are normal. There is no distension.     Palpations: Abdomen is soft. There is no mass.     Tenderness: There is no abdominal tenderness. There is no guarding.  Musculoskeletal:     Cervical back: Normal range of motion.  Skin:    General: Skin is warm and dry.  Neurological:     Mental Status: She is alert and oriented to person, place, and time.  Psychiatric:        Behavior: Behavior normal.     ED Results / Procedures / Treatments   Labs (all labs ordered are listed, but only abnormal results are displayed) Labs Reviewed  RESP PANEL BY RT-PCR (FLU A&B, COVID) [id number]  BASIC METABOLIC PANEL  BRAIN NATRIURETIC PEPTIDE    EKG EKG  Interpretation  Date/Time:  Sunday October 21 2020 18:07:19 EDT Ventricular Rate:  89 PR Interval:    QRS Duration: 95 QT Interval:  367 QTC Calculation: 447 R Axis:   69 Text Interpretation: Atrial fibrillation Low voltage, extremity and precordial leads 12 Lead; [place] Confirmed by Quintella Reichert [phone]) on 10/21/2020 8:18:19 PM   Radiology No results found.  Procedures Procedures   Medications Ordered in ED Medications - No data to display  ED Course  I have reviewed the triage vital signs and the nursing notes.  Pertinent labs & imaging results that were available during my care of the patient were reviewed by me and considered in my medical decision making (see chart for details).    MDM Rules/Calculators/A&P                          CC:uri/cough/sob VS:  Vitals:   10/21/20 1844 10/21/20 1907 10/21/20 2000 10/21/20 2143  BP: (!) 150/76 (!) 145/80 (!) 147/77 128/88  Pulse: 75 87 (!) 47 75  Resp: _0 Temp: 98.1 F (36.7 C)   98 F (36.7 C)  TempSrc: Oral   Oral  SpO2: 100% 100% 97% 100%    MB:WGYKZLD is gathered by patient and emr. Previous records obtained and reviewed. DDX:The patient's complaint of sob involves an extensive number of diagnostic and treatment options, and is a complaint that carries with it a high risk of complications, morbidity, and potential mortality. Given the large differential diagnosis, medical decision making is of high complexity. The emergent differential diagnosis for shortness of breath includes, but is not limited to, Pulmonary edema, bronchoconstriction, Pneumonia, Pulmonary embolism, Pneumotherax/ Hemothorax, Dysrythmia, ACS.   Labs: I ordered reviewed and interpreted labs which include CBC and BMP without significant abnormality, PT/INR shows therapeutic INR.  BMP within normal limits. Imaging: I ordered and reviewed images which included  chest x-ray. I independently visualized and interpreted all imaging.There are no  acute, significant findings on today's images. EKG: Controlled A. fib at a rate of 89 Consults: MDM: Patient here with URI symptoms.  No obvious work of breathing, wheezing.  No emergent causes of shortness of breath.  Patient's respiratory panel is negative for COVID and influenza.  Discussed outpatient follow-up and return precautions.  Discharged with Tessalon and albuterol. Patient disposition:The patient appears reasonably screened and/or stabilized for discharge and I doubt any other medical condition or other [facility] requiring further screening, evaluation, or treatment in the ED at this time prior to discharge. I have discussed lab and/or imaging findings with the patient and answered all questions/concerns to the best of my ability.I have discussed return precautions and OP follow up.    Final Clinical Impression(s) / ED Diagnoses Final diagnoses:  None    Rx / DC Orders ED Discharge Orders    None       Margarita Mail, PA-C 10/21/20 2233    Tegeler, Gwenyth Allegra, MD 10/22/20 0000

## 2020-10-21 NOTE — ED Triage Notes (Signed)
Patient here from home reporting sob x4 days. Coughing no fever. Covid vaccinated. Hx of a fib with blood thinner.

## 2020-10-30 DIAGNOSIS — I480 Paroxysmal atrial fibrillation: Secondary | ICD-10-CM | POA: Diagnosis not present

## 2020-10-30 DIAGNOSIS — D6869 Other thrombophilia: Secondary | ICD-10-CM | POA: Diagnosis not present

## 2020-10-30 DIAGNOSIS — I10 Essential (primary) hypertension: Secondary | ICD-10-CM | POA: Diagnosis not present

## 2020-10-30 DIAGNOSIS — J4521 Mild intermittent asthma with (acute) exacerbation: Secondary | ICD-10-CM | POA: Diagnosis not present

## 2020-10-31 ENCOUNTER — Ambulatory Visit: Payer: PPO

## 2020-11-06 ENCOUNTER — Telehealth: Payer: Self-pay | Admitting: *Deleted

## 2020-11-06 ENCOUNTER — Ambulatory Visit: Payer: PPO

## 2020-11-06 NOTE — Telephone Encounter (Signed)
Called pt since she is overdue for her appt. She stated she had been sick and canceled last appt. Her husband was in the background calling out medication she had been on. She states that she took Prednisone 20mg  daily for 1 week then 40mg  daily for 5 days. She states she wants to let the medication get out of her system before coming into the office. She was made aware that the INR could remain elevated and she should have it checked soon. She stated she would rather wait than come in, she is aware of risks in the event it is elevated. She prefers to come in 2 weeks and advised we need to check sooner but she wants to come on 11/19/20; appt made per pt request.

## 2020-11-23 ENCOUNTER — Encounter (INDEPENDENT_AMBULATORY_CARE_PROVIDER_SITE_OTHER): Payer: Self-pay

## 2020-11-23 ENCOUNTER — Other Ambulatory Visit: Payer: Self-pay

## 2020-11-23 ENCOUNTER — Ambulatory Visit (INDEPENDENT_AMBULATORY_CARE_PROVIDER_SITE_OTHER): Payer: PPO

## 2020-11-23 DIAGNOSIS — I4891 Unspecified atrial fibrillation: Secondary | ICD-10-CM | POA: Diagnosis not present

## 2020-11-23 DIAGNOSIS — Z5181 Encounter for therapeutic drug level monitoring: Secondary | ICD-10-CM

## 2020-11-23 LAB — POCT INR: INR: 2.1 (ref 2.0–3.0)

## 2020-11-23 NOTE — Patient Instructions (Signed)
Description   Continue taking Warfarin 1 tablet every day. Recheck INR in 6 weeks. Coumadin Clinic [phone]

## 2020-12-06 ENCOUNTER — Other Ambulatory Visit: Payer: Self-pay | Admitting: Cardiovascular Disease

## 2020-12-06 NOTE — Telephone Encounter (Signed)
Prescription refill request received for warfarin Lov: Nahser Next INR check: 7/7 Warfarin tablet strength: 5mg 

## 2021-01-01 ENCOUNTER — Other Ambulatory Visit: Payer: Self-pay

## 2021-01-01 ENCOUNTER — Ambulatory Visit (INDEPENDENT_AMBULATORY_CARE_PROVIDER_SITE_OTHER): Payer: PPO | Admitting: Pharmacist

## 2021-01-01 ENCOUNTER — Ambulatory Visit (INDEPENDENT_AMBULATORY_CARE_PROVIDER_SITE_OTHER): Payer: PPO | Admitting: *Deleted

## 2021-01-01 DIAGNOSIS — Z5181 Encounter for therapeutic drug level monitoring: Secondary | ICD-10-CM | POA: Diagnosis not present

## 2021-01-01 DIAGNOSIS — I4891 Unspecified atrial fibrillation: Secondary | ICD-10-CM

## 2021-01-01 DIAGNOSIS — I10 Essential (primary) hypertension: Secondary | ICD-10-CM

## 2021-01-01 LAB — POCT INR: INR: 2.1 (ref 2.0–3.0)

## 2021-01-01 NOTE — Patient Instructions (Signed)
It was nice to meet you!  Please continue taking hydrochlorothiazide 25mg  three times a week  Please call me at [phone] with any questions

## 2021-01-01 NOTE — Patient Instructions (Signed)
Description   Continue taking Warfarin 1 tablet every day. Recheck INR in 6 weeks. Coumadin Clinic [phone]

## 2021-01-01 NOTE — Progress Notes (Signed)
Patient ID: [name]                 DOB: [birth year]                      MRN: [id number]     HPI: [name] is a 87 y.o. female referred by Dr. Acie Fredrickson to HTN clinic. PMH is significant for afib, HTN, syncope and CVA. She saw Dr. Acie Fredrickson on 09/18/20. She had experienced another episode of syncope. BP was on the lower side so amlodipine was discontinued and patient was referred to HTN clinic for follow up. She was advised that if stopping amlodipine didn't help with syncope she would need a implantable loop recorder.  Patient presents today to HTN clinic. She states she has not passed out since seeing Dr. Acie Fredrickson. No dizziness/lightheadedness. Knows to take her time standing up. Only symptom in fatigue. Use to walk 5 miles a day and cannot do that now. She does do all her own housework and take care of her husband. She brings in a list of about 25 blood pressures.  INR was checked today which was in range.  Current HTN meds: HCTZ 44m three times a week Previously tried: amlodipine 561m(hypotension) BP goal: <140/90  Family History:  Family History  Problem Relation Age of Onset   Cancer Brother        esophagus   Cancer Mother        breast   Heart attack Father      Social History:  Social History   Socioeconomic History   Marital status: Married    Spouse name: Not on file   Number of children: Not on file   Years of education: Not on file   Highest education level: Not on file  Occupational History   Not on file  Tobacco Use   Smoking status: Never   Smokeless tobacco: Never  Vaping Use   Vaping Use: Never used  Substance and Sexual Activity   Alcohol use: No   Drug use: No   Sexual activity: Not on file    Comment: menarche [age] age [age]hysterectomy '62, no HRT  Other Topics Concern   Not on file  Social History Narrative   Not on file   Social Determinants of Health   Financial Resource Strain: Not on file  Food Insecurity: Not on file   Transportation Needs: Not on file  Physical Activity: Not on file  Stress: Not on file  Social Connections: Not on file  Intimate Partner Violence: Not on file    Diet: wasn't addressed  Exercise: cleans house  Home BP readings: 141/83, 113/78, 125/66, 124/78, 125/66, 140/82, 154/77, 125/72, 128/72, 109/61, 108/55, 134/66, 123/63  Wt Readings from Last 3 Encounters:  09/18/20 134 lb (60.8 kg)  02/24/19 145 lb 12.8 oz (66.1 kg)  01/05/19 143 lb (64.9 kg)   BP Readings from Last 3 Encounters:  10/21/20 128/88  09/18/20 104/68  05/04/19 132/74   Pulse Readings from Last 3 Encounters:  10/21/20 75  09/18/20 85  05/04/19 99    Renal function: CrCl cannot be calculated (Patient's most recent lab result is older than the maximum 21 days allowed.).  Past Medical History:  Diagnosis Date   Arrhythmia    PAF   Atrial fibrillation ([facility]   [place]   R lumpectomy   Breast cancer ([facility] biopsy    right, inv mammary, ER/PR +,  hER2 -   Breast lump    Bruises easily    Cancer ([facility])    Dyslipidemia    Heart disease    Hx of radiation therapy 09/04/10 to 10/02/10   R breast   Hypercholesterolemia    Hypertension    Hypothyroidism    Incontinence    Osteopenia    Stroke ([place]) 03/18/03   Thyroid disease    Wears glasses     Current Outpatient Medications on File Prior to Visit  Medication Sig Dispense Refill   acetaminophen (TYLENOL) 325 MG tablet Take 650 mg by mouth 2 (two) times a week. As needed for pain     albuterol (VENTOLIN HFA) 108 (90 Base) MCG/ACT inhaler Inhale 1-2 puffs into the lungs every 6 (six) hours as needed for wheezing or shortness of breath. 8 g 0   benzonatate (TESSALON PERLES) 100 MG capsule Take 1 capsule (100 mg total) by mouth 3 (three) times daily as needed for cough (cough). 20 capsule 0   calcium-vitamin D (OSCAL WITH D) 500-200 MG-UNIT per tablet Take 1 tablet by mouth daily.     FLUZONE HIGH-DOSE QUADRIVALENT 0.7 ML SUSY       hydrochlorothiazide (HYDRODIURIL) 25 MG tablet Take 25 mg by mouth 3 (three) times a week.      levothyroxine (SYNTHROID, LEVOTHROID) 50 MCG tablet Take 1 tablet (50 mcg total) by mouth daily. 90 tablet 3   Multiple Vitamin (MULTIVITAMIN WITH MINERALS) TABS Take 1 tablet by mouth daily.     pravastatin (PRAVACHOL) 40 MG tablet Take 40 mg by mouth daily.     traMADol (ULTRAM) 50 MG tablet Take 50 mg by mouth as needed for moderate pain.      warfarin (JANTOVEN) 5 MG tablet TAKE AS INSTRUCTED BY COUMADIN CLINIC 100 tablet 0   No current facility-administered medications on file prior to visit.    No Known Allergies   Assessment/Plan:  1. Hypertension - Blood pressure is well controlled. Mostly at goal of <140/90. Rare BP above that. Mostly around 120's. She has not had any syncope since stopping amlodipine. Unsure if syncope was due to BP, but BP does not appear too low now. Advised if she has another episode, she needs to let us know because an implantable recorder is advised. Continue HCTZ three times a week. Follow up as needed.  Ramond Dial, Pharm.D, BCPS, CPP [place]  [address] [place], [place] [address]  Phone: [phone]; Fax: [phone]

## 2021-01-04 DIAGNOSIS — Z1231 Encounter for screening mammogram for malignant neoplasm of breast: Secondary | ICD-10-CM | POA: Diagnosis not present

## 2021-02-18 ENCOUNTER — Other Ambulatory Visit: Payer: Self-pay

## 2021-02-18 ENCOUNTER — Ambulatory Visit (INDEPENDENT_AMBULATORY_CARE_PROVIDER_SITE_OTHER): Payer: PPO | Admitting: *Deleted

## 2021-02-18 DIAGNOSIS — I4891 Unspecified atrial fibrillation: Secondary | ICD-10-CM | POA: Diagnosis not present

## 2021-02-18 DIAGNOSIS — Z5181 Encounter for therapeutic drug level monitoring: Secondary | ICD-10-CM

## 2021-02-18 LAB — POCT INR: INR: 4.9 — AB (ref 2.0–3.0)

## 2021-02-18 NOTE — Patient Instructions (Signed)
Description   Do not take any Warfarin and No warfarin tomorrow then continue taking Warfarin 1 tablet everyday. Recheck INR in 2 weeks. Coumadin Clinic [phone]

## 2021-03-05 DIAGNOSIS — E7800 Pure hypercholesterolemia, unspecified: Secondary | ICD-10-CM | POA: Diagnosis not present

## 2021-03-05 DIAGNOSIS — I700 Atherosclerosis of aorta: Secondary | ICD-10-CM | POA: Diagnosis not present

## 2021-03-05 DIAGNOSIS — D6869 Other thrombophilia: Secondary | ICD-10-CM | POA: Diagnosis not present

## 2021-03-05 DIAGNOSIS — J4521 Mild intermittent asthma with (acute) exacerbation: Secondary | ICD-10-CM | POA: Diagnosis not present

## 2021-03-05 DIAGNOSIS — E039 Hypothyroidism, unspecified: Secondary | ICD-10-CM | POA: Diagnosis not present

## 2021-03-05 DIAGNOSIS — Z0000 Encounter for general adult medical examination without abnormal findings: Secondary | ICD-10-CM | POA: Diagnosis not present

## 2021-03-05 DIAGNOSIS — L409 Psoriasis, unspecified: Secondary | ICD-10-CM | POA: Diagnosis not present

## 2021-03-05 DIAGNOSIS — Z1331 Encounter for screening for depression: Secondary | ICD-10-CM | POA: Diagnosis not present

## 2021-03-05 DIAGNOSIS — Z79899 Other long term (current) drug therapy: Secondary | ICD-10-CM | POA: Diagnosis not present

## 2021-03-05 DIAGNOSIS — I10 Essential (primary) hypertension: Secondary | ICD-10-CM | POA: Diagnosis not present

## 2021-03-05 DIAGNOSIS — I480 Paroxysmal atrial fibrillation: Secondary | ICD-10-CM | POA: Diagnosis not present

## 2021-03-06 ENCOUNTER — Other Ambulatory Visit: Payer: Self-pay | Admitting: Cardiovascular Disease

## 2021-03-06 NOTE — Telephone Encounter (Signed)
Prescription refill request received for warfarin Lov: 09/18/20 ([facility])  Next INR check: 03/11/21 Warfarin tablet strength: '5mg'$   Appropriate dose and refill sent to requested pharmacy.

## 2021-03-07 DIAGNOSIS — R300 Dysuria: Secondary | ICD-10-CM | POA: Diagnosis not present

## 2021-03-11 ENCOUNTER — Ambulatory Visit (INDEPENDENT_AMBULATORY_CARE_PROVIDER_SITE_OTHER): Payer: PPO

## 2021-03-11 ENCOUNTER — Other Ambulatory Visit: Payer: Self-pay

## 2021-03-11 DIAGNOSIS — Z5181 Encounter for therapeutic drug level monitoring: Secondary | ICD-10-CM | POA: Diagnosis not present

## 2021-03-11 DIAGNOSIS — I4891 Unspecified atrial fibrillation: Secondary | ICD-10-CM | POA: Diagnosis not present

## 2021-03-11 LAB — POCT INR: INR: 2.2 (ref 2.0–3.0)

## 2021-03-11 NOTE — Patient Instructions (Signed)
Description   Continue taking Warfarin 1 tablet everyday. Recheck INR in 4 weeks. Coumadin Clinic [phone]

## 2021-03-20 DIAGNOSIS — R928 Other abnormal and inconclusive findings on diagnostic imaging of breast: Secondary | ICD-10-CM | POA: Diagnosis not present

## 2021-04-08 ENCOUNTER — Other Ambulatory Visit: Payer: Self-pay

## 2021-04-08 ENCOUNTER — Ambulatory Visit (INDEPENDENT_AMBULATORY_CARE_PROVIDER_SITE_OTHER): Payer: PPO

## 2021-04-08 DIAGNOSIS — Z5181 Encounter for therapeutic drug level monitoring: Secondary | ICD-10-CM

## 2021-04-08 DIAGNOSIS — I4891 Unspecified atrial fibrillation: Secondary | ICD-10-CM | POA: Diagnosis not present

## 2021-04-08 LAB — POCT INR: INR: 2.5 (ref 2.0–3.0)

## 2021-04-08 NOTE — Patient Instructions (Signed)
Continue taking Warfarin 1 tablet everyday. Recheck INR in 5 weeks. Coumadin Clinic [phone]

## 2021-05-09 ENCOUNTER — Ambulatory Visit (INDEPENDENT_AMBULATORY_CARE_PROVIDER_SITE_OTHER): Payer: PPO

## 2021-05-09 ENCOUNTER — Other Ambulatory Visit: Payer: Self-pay

## 2021-05-09 ENCOUNTER — Ambulatory Visit: Payer: PPO | Admitting: Podiatry

## 2021-05-09 ENCOUNTER — Encounter: Payer: Self-pay | Admitting: Podiatry

## 2021-05-09 DIAGNOSIS — M7751 Other enthesopathy of right foot: Secondary | ICD-10-CM

## 2021-05-09 DIAGNOSIS — M779 Enthesopathy, unspecified: Secondary | ICD-10-CM | POA: Diagnosis not present

## 2021-05-09 DIAGNOSIS — M7752 Other enthesopathy of left foot: Secondary | ICD-10-CM

## 2021-05-09 MED ORDER — TRIAMCINOLONE ACETONIDE 10 MG/ML IJ SUSP
20.0000 mg | Freq: Once | INTRAMUSCULAR | Status: AC
  Administered 2021-05-09: 20 mg

## 2021-05-09 NOTE — Progress Notes (Signed)
Subjective:   Patient ID: [name], female   DOB: 87 y.o.   MRN: [id number]   HPI Patient states that she gets pain mostly at night and it increases with pain in her ankles but unable to determine exactly where.  States it can interrupt her sleep and she did respond well when we treated neuromas last year.  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  Patient will be seen back as symptoms indicate hopefully this will help her  X-rays were negative for signs of there is advanced arthritis and no indications of stress fracture associated with this condition

## 2021-05-13 ENCOUNTER — Other Ambulatory Visit: Payer: Self-pay

## 2021-05-13 ENCOUNTER — Ambulatory Visit (INDEPENDENT_AMBULATORY_CARE_PROVIDER_SITE_OTHER): Payer: PPO

## 2021-05-13 DIAGNOSIS — I4891 Unspecified atrial fibrillation: Secondary | ICD-10-CM | POA: Diagnosis not present

## 2021-05-13 DIAGNOSIS — Z5181 Encounter for therapeutic drug level monitoring: Secondary | ICD-10-CM

## 2021-05-13 LAB — POCT INR: INR: 2.1 (ref 2.0–3.0)

## 2021-05-13 NOTE — Patient Instructions (Signed)
Description   Continue taking Warfarin 1 tablet everyday. Recheck INR in 6 weeks. Coumadin Clinic [phone]

## 2021-07-11 ENCOUNTER — Other Ambulatory Visit: Payer: Self-pay

## 2021-07-11 ENCOUNTER — Ambulatory Visit (INDEPENDENT_AMBULATORY_CARE_PROVIDER_SITE_OTHER): Payer: PPO | Admitting: *Deleted

## 2021-07-11 DIAGNOSIS — Z5181 Encounter for therapeutic drug level monitoring: Secondary | ICD-10-CM

## 2021-07-11 DIAGNOSIS — I4891 Unspecified atrial fibrillation: Secondary | ICD-10-CM | POA: Diagnosis not present

## 2021-07-11 LAB — POCT INR: INR: 4.5 — AB (ref 2.0–3.0)

## 2021-07-11 NOTE — Patient Instructions (Signed)
Description   Do not take any Warfarin today and No Warfarin tomorrow then continue taking Warfarin 1 tablet everyday. Recheck INR in in 2 weeks (normally 6 weeks). Coumadin Clinic [phone]

## 2021-07-25 ENCOUNTER — Other Ambulatory Visit: Payer: Self-pay

## 2021-07-25 ENCOUNTER — Ambulatory Visit (INDEPENDENT_AMBULATORY_CARE_PROVIDER_SITE_OTHER): Payer: PPO | Admitting: *Deleted

## 2021-07-25 DIAGNOSIS — I4891 Unspecified atrial fibrillation: Secondary | ICD-10-CM | POA: Diagnosis not present

## 2021-07-25 DIAGNOSIS — Z5181 Encounter for therapeutic drug level monitoring: Secondary | ICD-10-CM | POA: Diagnosis not present

## 2021-07-25 LAB — POCT INR: INR: 2.7 (ref 2.0–3.0)

## 2021-07-25 NOTE — Patient Instructions (Addendum)
Description   Continue taking Warfarin 1 tablet everyday. Recheck INR in in 6 weeks. Coumadin Clinic [phone]

## 2021-08-28 ENCOUNTER — Other Ambulatory Visit: Payer: Self-pay | Admitting: Cardiovascular Disease

## 2021-08-28 NOTE — Telephone Encounter (Signed)
Prescription refill request received for warfarin ?Lov: 09/18/20 ([facility]) - Pt has appt on 10/14/21 with Dr Acie Fredrickson ?Next INR check: 09/05/21 ?Warfarin tablet strength: 5mg  ? ?Appropriate dose and refill sent to requested pharmacy.  ? ? ?

## 2021-09-05 ENCOUNTER — Other Ambulatory Visit: Payer: Self-pay

## 2021-09-05 ENCOUNTER — Ambulatory Visit (INDEPENDENT_AMBULATORY_CARE_PROVIDER_SITE_OTHER): Payer: PPO | Admitting: *Deleted

## 2021-09-05 DIAGNOSIS — Z5181 Encounter for therapeutic drug level monitoring: Secondary | ICD-10-CM | POA: Diagnosis not present

## 2021-09-05 DIAGNOSIS — I4891 Unspecified atrial fibrillation: Secondary | ICD-10-CM

## 2021-09-05 LAB — POCT INR: INR: 1.7 — AB (ref 2.0–3.0)

## 2021-09-05 NOTE — Patient Instructions (Signed)
Description   ?Today take 1.5 tablets (7.'5mg'$ ) then continue taking Warfarin 1 tablet ('5mg'$ ) everyday. Recheck INR in in 4 weeks. Coumadin Clinic [phone] ?  ?  ?

## 2021-09-09 DIAGNOSIS — I480 Paroxysmal atrial fibrillation: Secondary | ICD-10-CM | POA: Diagnosis not present

## 2021-09-09 DIAGNOSIS — I10 Essential (primary) hypertension: Secondary | ICD-10-CM | POA: Diagnosis not present

## 2021-09-09 DIAGNOSIS — D6869 Other thrombophilia: Secondary | ICD-10-CM | POA: Diagnosis not present

## 2021-09-09 DIAGNOSIS — E7800 Pure hypercholesterolemia, unspecified: Secondary | ICD-10-CM | POA: Diagnosis not present

## 2021-09-09 DIAGNOSIS — I700 Atherosclerosis of aorta: Secondary | ICD-10-CM | POA: Diagnosis not present

## 2021-09-30 DIAGNOSIS — R051 Acute cough: Secondary | ICD-10-CM | POA: Diagnosis not present

## 2021-10-01 DIAGNOSIS — J40 Bronchitis, not specified as acute or chronic: Secondary | ICD-10-CM | POA: Diagnosis not present

## 2021-10-10 ENCOUNTER — Other Ambulatory Visit: Payer: Self-pay | Admitting: Internal Medicine

## 2021-10-10 ENCOUNTER — Ambulatory Visit
Admission: RE | Admit: 2021-10-10 | Discharge: 2021-10-10 | Disposition: A | Discharge status: Home / Self Care | Payer: PPO | Source: Ambulatory Visit | Attending: Internal Medicine | Admitting: Internal Medicine

## 2021-10-10 DIAGNOSIS — R1031 Right lower quadrant pain: Secondary | ICD-10-CM | POA: Diagnosis not present

## 2021-10-10 DIAGNOSIS — K5901 Slow transit constipation: Secondary | ICD-10-CM | POA: Diagnosis not present

## 2021-10-14 ENCOUNTER — Ambulatory Visit (INDEPENDENT_AMBULATORY_CARE_PROVIDER_SITE_OTHER): Payer: PPO | Admitting: *Deleted

## 2021-10-14 ENCOUNTER — Encounter: Payer: Self-pay | Admitting: Cardiovascular Disease

## 2021-10-14 ENCOUNTER — Ambulatory Visit: Payer: PPO | Admitting: Cardiovascular Disease

## 2021-10-14 VITALS — BP 112/68 | HR 82 | Ht 62.0 in | Wt 121.2 lb

## 2021-10-14 DIAGNOSIS — I4891 Unspecified atrial fibrillation: Secondary | ICD-10-CM

## 2021-10-14 DIAGNOSIS — Z5181 Encounter for therapeutic drug level monitoring: Secondary | ICD-10-CM | POA: Diagnosis not present

## 2021-10-14 LAB — POCT INR: INR: 4.8 — AB (ref 2.0–3.0)

## 2021-10-14 NOTE — Patient Instructions (Addendum)
Description   ?Do not take any Warfarin today and no Warfarin tomorrow then continue taking Warfarin 1 tablet ('5mg'$ ) everyday. Stay consistent with leafy veggies. Recheck INR in in 2 weeks. Coumadin Clinic [phone] ?  ?  ? ?

## 2021-10-14 NOTE — Patient Instructions (Signed)
Medication Instructions:  ?Your physician recommends that you continue on your current medications as directed. Please refer to the Current Medication list given to you today. ? ?*If you need a refill on your cardiac medications before your next appointment, please call your pharmacy* ? ?Lab Work: ?NONE ? ?Testing/Procedures: ?NONE ? ?Follow-Up: ?At [facility], you and your health needs are our priority.  As part of our continuing mission to provide you with exceptional heart care, we have created designated Provider Care Teams.  These Care Teams include your primary Cardiologist (physician) and Advanced Practice Providers (APPs -  Physician Assistants and Nurse Practitioners) who all work together to provide you with the care you need, when you need it. ? ?We recommend signing up for the patient portal called "MyChart".  Sign up information is provided on this After Visit Summary.  MyChart is used to connect with patients for Virtual Visits (Telemedicine).  Patients are able to view lab/test results, encounter notes, upcoming appointments, etc.  Non-urgent messages can be sent to your provider as well.   ?To learn more about what you can do with MyChart, go to [email or web address].   ? ?Your next appointment:   ?1 year(s) ? ?The format for your next appointment:   ?In Person ? ?Provider:   ?Mertie Moores, MD  or Robbie Lis, PA-C, Christen Bame, NP, or Richardson Dopp, PA-C      ? ?Other Instructions ? ?Important Information About Sugar ? ? ? ? ?  ?
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
## 2021-10-18 ENCOUNTER — Other Ambulatory Visit: Payer: Self-pay | Admitting: Internal Medicine

## 2021-10-18 ENCOUNTER — Ambulatory Visit
Admission: RE | Admit: 2021-10-18 | Discharge: 2021-10-18 | Disposition: A | Discharge status: Home / Self Care | Payer: PPO | Source: Ambulatory Visit | Attending: Internal Medicine | Admitting: Internal Medicine

## 2021-10-18 DIAGNOSIS — K7689 Other specified diseases of liver: Secondary | ICD-10-CM | POA: Diagnosis not present

## 2021-10-18 DIAGNOSIS — R1084 Generalized abdominal pain: Secondary | ICD-10-CM

## 2021-10-18 DIAGNOSIS — D7389 Other diseases of spleen: Secondary | ICD-10-CM | POA: Diagnosis not present

## 2021-10-18 DIAGNOSIS — K3189 Other diseases of stomach and duodenum: Secondary | ICD-10-CM | POA: Diagnosis not present

## 2021-10-18 DIAGNOSIS — Z853 Personal history of malignant neoplasm of breast: Secondary | ICD-10-CM | POA: Diagnosis not present

## 2021-10-18 MED ORDER — IOPAMIDOL (ISOVUE-300) INJECTION 61%
100.0000 mL | Freq: Once | INTRAVENOUS | Status: AC | PRN
  Administered 2021-10-18: 100 mL via INTRAVENOUS

## 2021-10-23 ENCOUNTER — Telehealth: Payer: Self-pay | Admitting: Cardiovascular Disease

## 2021-10-23 DIAGNOSIS — D6869 Other thrombophilia: Secondary | ICD-10-CM | POA: Diagnosis not present

## 2021-10-23 DIAGNOSIS — I10 Essential (primary) hypertension: Secondary | ICD-10-CM | POA: Diagnosis not present

## 2021-10-23 DIAGNOSIS — I480 Paroxysmal atrial fibrillation: Secondary | ICD-10-CM | POA: Diagnosis not present

## 2021-10-23 DIAGNOSIS — I700 Atherosclerosis of aorta: Secondary | ICD-10-CM

## 2021-10-23 DIAGNOSIS — R1084 Generalized abdominal pain: Secondary | ICD-10-CM | POA: Diagnosis not present

## 2021-10-23 NOTE — Telephone Encounter (Signed)
? ? ?  Patient Name: [name]  ?DOB: 22-Sep-1929 ?MRN: [id number] ? ?Primary Cardiologist: Mertie Moores, MD ? ?Chart reviewed as part of pre-operative protocol coverage. The patient was doing well when seen by Dr. Acie Fredrickson 10/14/21. Given past medical history and time since last visit, based on ACC/AHA guidelines, DALLANA MAVITY would be at acceptable risk for the planned procedure without further cardiovascular testing.  ? ?Pharmacy to review anticoagulation.  ?

## 2021-10-23 NOTE — Telephone Encounter (Signed)
? ?  [facility] Pre-operative Risk Assessment  ?  ?Request for surgical clearance: ? ?What type of surgery is being performed?  ?Diagnostic Paracentesis  ? ?When is this surgery scheduled?  ?TBD   ? ?What type of clearance is required (medical clearance vs. Pharmacy clearance to hold med vs. Both)?  ?Both  ? ?Are there any medications that need to be held prior to surgery and how long? ?Coumadin, their office is requesting our recommendation   ? ?Practice name and name of physician performing surgery?  ?[facility] Interventional Radiology   ? ?What is your office phone number? ?[phone]  ?  ?7.   What is your office fax number? ?[phone] ? ?8.   Anesthesia type (None, local, MAC, general) ?  ? ?Unsure  ? ? ?[name] ?10/23/2021, 9:45 AM  ?

## 2021-10-23 NOTE — Telephone Encounter (Signed)
Allie stated that pt is having a Paracentesis. They would like for Dr. Acie Fredrickson to provide the pt with instructions on when to hold and how long to hold Coumadin. Please advise ?

## 2021-10-23 NOTE — Telephone Encounter (Signed)
Left message for requesting office to call back as the pharm-d is needing to clarify hold time for the Warfarin. See notes from pharm-d. ?

## 2021-10-23 NOTE — Telephone Encounter (Signed)
Patient with diagnosis of afib on warfarin for anticoagulation.   ? ?Procedure: diagnostic paracentesis ?Date of procedure: TBD ? ?CHA2DS2-VASc Score = 7  ?This indicates a 11.2% annual risk of stroke. ?The patient's score is based upon: ?CHF History: 0 ?HTN History: 1 ?Diabetes History: 0 ?Stroke History: 2 ?Vascular Disease History: 1 ?Age Score: 2 ?Gender Score: 1 ?  ?CrCl 34m/min ?Platelet count 180K ? ?Should be a low bleed risk procedure - would clarify if pt actually needs to hold warfarin before, or if 1 or 2 day hold would be sufficient. If she has a full 5 day hold, she's at elevated risk and would likely need a Lovenox bridge which is not ideal given her advanced age. ?

## 2021-10-23 NOTE — Telephone Encounter (Signed)
?  I will route this recommendation to the requesting party via Epic fax function and remove from pre-op pool. ? ?Please call with questions. ? ?Leanor Kail, PA ?10/23/2021, 2:52 PM  ?

## 2021-10-24 ENCOUNTER — Other Ambulatory Visit (HOSPITAL_COMMUNITY): Payer: Self-pay | Admitting: Internal Medicine

## 2021-10-24 ENCOUNTER — Other Ambulatory Visit: Payer: Self-pay | Admitting: Internal Medicine

## 2021-10-24 NOTE — Telephone Encounter (Addendum)
To clarify was calling the number on the clearance form. I will try to reach out to IR. Do we think this is being done at [facility]?  ? ?Allie, from Dr. Carlyle Lipa office called back. She stated Dr. Felipa Eth was hoping that Dr. Acie Fredrickson would give recommendations about warfarin. I read the notes from the pharm-d in regard to warfarin, depending hold time will determine if the pt will need Lovenox bridge or not. I stated that I will call IR at [facility].  ? ?I left message for Miles Costain in Interventional Radiology Dept to please cal, our office and ask to s/w the pre op team, in regard to hold time for Warfarin.  ? ?

## 2021-10-24 NOTE — Telephone Encounter (Signed)
Left message x 2 for Allie with Dr. Felipa Eth office. See notes from Pharm-D [place] in regard to hold time for Warfarin. Please reply, so that we may proceed with clearance for the pt.  ?

## 2021-10-24 NOTE — Telephone Encounter (Addendum)
Pt's PCP shouldn't need to be reached regarding this, the office performing the procedure does which looks to be interventional radiology. Is the procedure being done in her PCP office? ?

## 2021-10-24 NOTE — Telephone Encounter (Signed)
I apologize, I have sent this to the wrong Dr. Felipa Eth. This is meant for Dr. Lajean Manes. I will fax to Dr. Lajean Manes at this time.  ?

## 2021-10-25 ENCOUNTER — Telehealth (HOSPITAL_COMMUNITY): Payer: Self-pay | Admitting: Radiology

## 2021-10-25 NOTE — Telephone Encounter (Signed)
This is a duplicate encounter. Already being addressed in separate clearance note. ?

## 2021-10-25 NOTE — Telephone Encounter (Signed)
[name], from Dr. Carlyle Lipa office called stating Dr. Acie Fredrickson needs to be the one that determine the recommendations about patient's warfarin, as he manages it.   ?

## 2021-10-25 NOTE — Telephone Encounter (Signed)
Called Allie with [place]'s office. I received a message concerning the patient hold Warfarin for a procedure and how long she needed to hold it. I asked for Allie to call me back to clarify what is needed from [facility] IR. We did not prescribe this medication nor do I have her down for any upcoming procedure in our area. Will await call back. JM ?

## 2021-10-28 ENCOUNTER — Other Ambulatory Visit (HOSPITAL_COMMUNITY): Payer: Self-pay | Admitting: Internal Medicine

## 2021-10-28 ENCOUNTER — Other Ambulatory Visit: Payer: Self-pay | Admitting: Internal Medicine

## 2021-10-28 DIAGNOSIS — R188 Other ascites: Secondary | ICD-10-CM

## 2021-10-28 NOTE — Telephone Encounter (Signed)
As below, it looks like we are awaiting input from IR about whether Warfarin needs to be held for 1-2 days or 5 full days. Please route to P CV DIV PREOP and P CV DIV PREOP PHARM when this has been clarified. ?

## 2021-10-28 NOTE — Telephone Encounter (Signed)
I called over to IR dept at [facility]. Left message x 3 for Miles Costain to please call our office as we are needing about warfarin to be held for pt's procedure. I then left message on [phone] for Caryl Pina, who is left on [facility] vm as additional contact if needed. See previous notes.  ?

## 2021-10-28 NOTE — Telephone Encounter (Signed)
Will also need to confirm where and to who do we need to fax clearance to once complete as the number on the clearance request seems to go to PCP office.  ?

## 2021-10-29 ENCOUNTER — Telehealth (HOSPITAL_COMMUNITY): Payer: Self-pay | Admitting: Radiology

## 2021-10-29 NOTE — Telephone Encounter (Signed)
Returned call to [name] concerning [name]'. I waited on hold for about 15 minutes. Will try back later. JM ?

## 2021-10-29 NOTE — Telephone Encounter (Signed)
I was able to secure with Miles Costain at [facility] In our question about warfarin hold, per Anderson Malta, pt does not need to hold warfarin. She states as well the procedure is tomorrow. I assured Anderson Malta that I will update the pre op provider as well as the pharm-d.  ?

## 2021-10-30 ENCOUNTER — Other Ambulatory Visit (HOSPITAL_COMMUNITY): Payer: PPO

## 2021-10-30 NOTE — Telephone Encounter (Signed)
? ?  Patient Name: [name]  ?DOB: [birth year] ?MRN: [id number] ? ?Primary Cardiologist: Mertie Moores, MD ? ?Chart reviewed as part of pre-operative protocol coverage.  ? ?Patient was recently seen in clinic 10/14/21 and felt to be doing well, therefore OK to proceed with paracentesis as planned. ? ?With regards to blood thinner, we have received clarification that patient did NOT need to hold warfarin for procedure and this is planned for today.  ? ?No further action needed. Will route this bundled recommendation to requesting provider via Epic fax function. Please call with questions. ? ?Charlie Pitter, PA-C ?10/30/2021, 8:13 AM ? ? ?

## 2021-10-31 ENCOUNTER — Ambulatory Visit (INDEPENDENT_AMBULATORY_CARE_PROVIDER_SITE_OTHER): Payer: PPO

## 2021-10-31 ENCOUNTER — Ambulatory Visit (HOSPITAL_COMMUNITY): Admission: RE | Admit: 2021-10-31 | Payer: PPO | Source: Ambulatory Visit

## 2021-10-31 ENCOUNTER — Encounter (HOSPITAL_COMMUNITY): Payer: Self-pay

## 2021-10-31 DIAGNOSIS — Z5181 Encounter for therapeutic drug level monitoring: Secondary | ICD-10-CM | POA: Diagnosis not present

## 2021-10-31 DIAGNOSIS — I4891 Unspecified atrial fibrillation: Secondary | ICD-10-CM

## 2021-10-31 LAB — POCT INR: INR: 6.1 — AB (ref 2.0–3.0)

## 2021-10-31 LAB — PROTIME-INR
INR: 7.2 (ref 0.9–1.2)
Prothrombin Time: 66.7 s — ABNORMAL HIGH (ref 9.1–12.0)

## 2021-10-31 NOTE — Patient Instructions (Signed)
Description   ?Called and spoke with pt and her neighbor, Secondary school teacher. Instructed to HOLD today's dose, tomorrow's dose, and Saturday's dose and then START taking Warfarin 1 tablet ('5mg'$ ) daily EXCEPT 0.5 tablet on Mondays, Wednesdays, and Fridays.  ?- Stay consistent with leafy veggies.  ?- Recheck INR in 1 week. ?- [facility] [phone] ?  ?   ?

## 2021-11-07 ENCOUNTER — Telehealth (HOSPITAL_COMMUNITY): Payer: Self-pay

## 2021-11-07 ENCOUNTER — Ambulatory Visit (INDEPENDENT_AMBULATORY_CARE_PROVIDER_SITE_OTHER): Payer: PPO

## 2021-11-07 DIAGNOSIS — Z5181 Encounter for therapeutic drug level monitoring: Secondary | ICD-10-CM | POA: Diagnosis not present

## 2021-11-07 DIAGNOSIS — I4891 Unspecified atrial fibrillation: Secondary | ICD-10-CM | POA: Diagnosis not present

## 2021-11-07 LAB — POCT INR: INR: 3 (ref 2.0–3.0)

## 2021-11-07 NOTE — Patient Instructions (Addendum)
Description   ?Only take 0.5 tablet today and then START taking Warfarin 1 tablet ('5mg'$ ) daily EXCEPT 0.5 tablet on Sundays, Mondays, Wednesdays, and Fridays.  ?- Stay consistent with leafy veggies (3-4 timers per week)  ?- Recheck INR in 2 weeks. ?- [facility] [phone] ?  ?   ?

## 2021-11-07 NOTE — Telephone Encounter (Signed)
Returned pt's call to schedule para, no answer, left vm for pt to call [phone] centralized scheduling to get scheduled. AW  ?

## 2021-11-08 ENCOUNTER — Telehealth: Payer: Self-pay | Admitting: Cardiovascular Disease

## 2021-11-08 NOTE — Telephone Encounter (Signed)
Patient states she was returning call. Please advise  

## 2021-11-08 NOTE — Telephone Encounter (Signed)
Reached out to patient who states that she had the wrong office in mind when she was returning our call. No needs at this time. ?

## 2021-11-11 ENCOUNTER — Ambulatory Visit (HOSPITAL_COMMUNITY)
Admission: RE | Admit: 2021-11-11 | Discharge: 2021-11-11 | Disposition: A | Discharge status: Home / Self Care | Payer: PPO | Source: Ambulatory Visit | Attending: Internal Medicine | Admitting: Internal Medicine

## 2021-11-11 DIAGNOSIS — R188 Other ascites: Secondary | ICD-10-CM | POA: Insufficient documentation

## 2021-11-11 HISTORY — PX: IR PARACENTESIS: IMG2679

## 2021-11-11 LAB — ALBUMIN, PLEURAL OR PERITONEAL FLUID: Albumin, Fluid: 2.3 g/dL

## 2021-11-11 LAB — BODY FLUID CELL COUNT WITH DIFFERENTIAL
Eos, Fluid: 4 %
Lymphs, Fluid: 70 %
Monocyte-Macrophage-Serous Fluid: 24 % — ABNORMAL LOW (ref 50–90)
Neutrophil Count, Fluid: 2 % (ref 0–25)
Total Nucleated Cell Count, Fluid: 705 cu mm (ref 0–1000)

## 2021-11-11 LAB — PROTEIN, PLEURAL OR PERITONEAL FLUID: Total protein, fluid: 3.7 g/dL

## 2021-11-11 LAB — GLUCOSE, PLEURAL OR PERITONEAL FLUID: Glucose, Fluid: 125 mg/dL

## 2021-11-11 MED ORDER — LIDOCAINE HCL 1 % IJ SOLN
INTRAMUSCULAR | Status: AC
  Administered 2021-11-11: 10 mL
  Filled 2021-11-11: qty 20

## 2021-11-13 LAB — CYTOLOGY - NON PAP

## 2021-11-15 ENCOUNTER — Telehealth: Payer: Self-pay | Admitting: *Deleted

## 2021-11-15 ENCOUNTER — Other Ambulatory Visit: Payer: Self-pay | Admitting: *Deleted

## 2021-11-15 DIAGNOSIS — R180 Malignant ascites: Secondary | ICD-10-CM

## 2021-11-15 DIAGNOSIS — Z853 Personal history of malignant neoplasm of breast: Secondary | ICD-10-CM

## 2021-11-15 DIAGNOSIS — R188 Other ascites: Secondary | ICD-10-CM

## 2021-11-15 NOTE — Telephone Encounter (Signed)
This RN contacted pt post MD review with need to obtain BX for definitive diagnosis for plan of care per discussion with Dr Felipa Eth.  Per call - pt placed her son Amnah Breuer on the line- he stated due to his mother's condition they are considering taking her back to [place] to be closer to family.  This RN

## 2021-11-19 ENCOUNTER — Ambulatory Visit (INDEPENDENT_AMBULATORY_CARE_PROVIDER_SITE_OTHER): Payer: PPO | Admitting: *Deleted

## 2021-11-19 DIAGNOSIS — I4891 Unspecified atrial fibrillation: Secondary | ICD-10-CM | POA: Diagnosis not present

## 2021-11-19 DIAGNOSIS — Z5181 Encounter for therapeutic drug level monitoring: Secondary | ICD-10-CM | POA: Diagnosis not present

## 2021-11-19 LAB — POCT INR: INR: 1.3 — AB (ref 2.0–3.0)

## 2021-11-19 NOTE — Patient Instructions (Addendum)
Description   Today take 1.5 tablets and 1 tablet tomorrow then start taking Warfarin 1 tablet ('5mg'$ ) daily EXCEPT 1/2 tablet on Monday, Wednesday, and Friday. Stay consistent with leafy veggies (3 times per week). Recheck INR in 1 week in [place]. [facility] Clinic [phone]

## 2021-11-20 ENCOUNTER — Telehealth: Payer: Self-pay | Admitting: *Deleted

## 2021-11-20 ENCOUNTER — Inpatient Hospital Stay: Payer: PPO | Attending: Hematology and Oncology | Admitting: Hematology and Oncology

## 2021-11-20 ENCOUNTER — Encounter: Payer: Self-pay | Admitting: Hematology and Oncology

## 2021-11-20 ENCOUNTER — Other Ambulatory Visit: Payer: Self-pay

## 2021-11-20 DIAGNOSIS — Z7901 Long term (current) use of anticoagulants: Secondary | ICD-10-CM | POA: Insufficient documentation

## 2021-11-20 DIAGNOSIS — K5900 Constipation, unspecified: Secondary | ICD-10-CM | POA: Insufficient documentation

## 2021-11-20 DIAGNOSIS — C50412 Malignant neoplasm of upper-outer quadrant of left female breast: Secondary | ICD-10-CM | POA: Insufficient documentation

## 2021-11-20 DIAGNOSIS — Z923 Personal history of irradiation: Secondary | ICD-10-CM | POA: Diagnosis not present

## 2021-11-20 DIAGNOSIS — Z170 Estrogen receptor positive status [ER+]: Secondary | ICD-10-CM | POA: Diagnosis not present

## 2021-11-20 DIAGNOSIS — Z79899 Other long term (current) drug therapy: Secondary | ICD-10-CM | POA: Diagnosis not present

## 2021-11-20 DIAGNOSIS — I480 Paroxysmal atrial fibrillation: Secondary | ICD-10-CM | POA: Diagnosis not present

## 2021-11-20 DIAGNOSIS — Z79811 Long term (current) use of aromatase inhibitors: Secondary | ICD-10-CM | POA: Insufficient documentation

## 2021-11-20 MED ORDER — WARFARIN SODIUM 5 MG PO TABS: ORAL_TABLET | ORAL | 0 refills | Status: AC

## 2021-11-20 NOTE — Telephone Encounter (Signed)
Called pt's son (on [place]) and advised I received confirmation that the INR order was received on today. They came by to pick up a copy of the standing order for an INR on next week and as needed for 6 months. The pt will see her new Cardiologist on 12/16/2021 and they will pick up managing.   They came at 325pm and the order was given to them in an envelope. Also, pt states she needed a refill to be sent in today for her Warfarin. Will send in a supply at this time.

## 2021-11-20 NOTE — Assessment & Plan Note (Signed)
This is an 88 year old female patient with past medical history significant for right breast invasive lobular carcinoma, ER/PR positive, HER2 negative, T1CNX at diagnosis status post lumpectomy followed by adjuvant radiation as well as adjuvant antiestrogen therapy with letrozole for 5 years who recently noticed some change in bowel habits, abdominal pain had CT imaging which showed scattered areas of nodularity and thickening throughout the omentum and mesentery concerning for possible peritoneal carcinomatosis.  She however could not move forward with biopsy since she is on chronic anticoagulation for atrial fibrillation.  She continues to report some abdominal pain but she is not a good historian, she says pain changes from time to time, she feels a bit better but she still notices that it is very hard to have a bowel movement and she has to take laxative. No palpable masses in the breast, chronically inverted left nipple, no palpable regional adenopathy.  Lungs clear to auscultation.  She is in a wheelchair, abdominal exam limited, overall no significant examination findings.  We have reviewed the imaging results which suggest possible peritoneal carcinomatosis but no definitive evidence of it.  We have discussed about considering biopsy however given chronic anticoagulation with warfarin, this is very challenging.  We have discussed about considering PET/CT to look for other possible primary since that this is not a typical presentation with breast although it can certainly be possible.  Most common causes of peritoneal carcinomatosis are gastrointestinal primaries as well as gynecological primaries.  She could consider evaluation with endoscopy however once again it is challenging to biopsy while on anticoagulation.  Patient appears to be leaning towards comfort care and hospice however son appears to be leaning towards at least investigating and finding a diagnosis before making plan of treatment.  Either  way, they are apparently moving to [facility] and he will have to reestablish with primary care, cardiology and medical oncology.  This will unfortunately delay the diagnosis a bit however may not change the overall outcome if she decides not to proceed with any treatment.  I do not believe empirically treating it as a breast cancer is a good idea here.  We will have to have some form of further tissue sampling to make the diagnosis and to discuss appropriate treatment recommendations.  Given her age and ongoing comorbidities as well as performance status, I do not believe she is a good candidate for aggressive treatment.  Considering palliative care and hospice if this is indeed metastatic carcinoma is not unreasonable.  All their questions were answered to the best of my knowledge.  Thank you for consulting [place] the care of this patient.  Please not hesitate to contact us with any additional questions or concerns.

## 2021-11-20 NOTE — Progress Notes (Signed)
[facility] CONSULT NOTE  Patient Care Team: Lajean Manes, MD as PCP - General (Internal Medicine) Nahser, Wonda Cheng, MD as PCP - Cardiology (Cardiology) Magrinat, Virgie Dad, MD (Inactive) (Hematology and Oncology) Vickie Epley, MD as Consulting Physician (Cardiology)  CHIEF COMPLAINTS/PURPOSE OF CONSULTATION:  Newly diagnosed breast cancer  HISTORY OF PRESENTING ILLNESS:  [name] 88 y.o. female is here because of history of right breast cancer  This is an 88 year old female patient with past medical history significant for right breast cancer status post right lumpectomy in February 2012 for T1CMX stage I invasive lobular cancer strongly ER/PR positive HER2 negative with a proliferation index of 9%.  She had radiation followed by 5 years of letrozole.  Most recently she complained of abdominal pain and some change in bowel habits which prompted CT imaging of the abdomen.  This showed small volume ascites as well as scattered areas of nodularity and thickening throughout the omentum and mesentery although this could potentially be secondary to third spacing, the appearance raises suspicion for peritoneal carcinomatosis.  Diagnostic paracentesis with fluid analysis was recommended. Cytology from this fluid showed atypical cells which are nonspecific.  She is here for follow-up with her son.  History is not quite clear from what she says but she says that she has been having some abdominal pain with changes in location, she has been having difficulty having bowel movements which she describes like a constipation and she has been using laxatives.  She otherwise denies any changes in her breast.  No other palpable masses that she reports.  Son tells me that she is moving to [place] and going to live with him.  Initially when the PCP called as we have asked for a CT-guided biopsy however since patient is on chronic anticoagulation, radiology could not proceed with  biopsy.  Patient today seems to be very disinterested in moving forward with investigation.  She tells me that she is already very tired and she does not want to take any treatments.  Rest of the pertinent 10 point ROS reviewed and negative    MEDICAL HISTORY:  Past Medical History:  Diagnosis Date   Arrhythmia    PAF   Atrial fibrillation ([facility])    Breast CA ([facility] [facility]) 08/06/10   R lumpectomy   Breast cancer ([facility]) 06/19/10 biopsy    right, inv mammary, ER/PR +, hER2 -   Breast lump    Bruises easily    Cancer ([facility])    Dyslipidemia    Heart disease    Hx of radiation therapy 09/04/10 to 10/02/10   R breast   Hypercholesterolemia    Hypertension    Hypothyroidism    Incontinence    Osteopenia    Stroke ([facility]) 03/18/03   Thyroid disease    Wears glasses     SURGICAL HISTORY: Past Surgical History:  Procedure Laterality Date   ABDOMINAL HYSTERECTOMY     unilat bso   APPENDECTOMY     BREAST LUMPECTOMY  06/2008   BREAST LUMPECTOMY  08/06/2010   R, INV LOBULAR, DCIS, ER/PR +, HER2-   CATARACT EXTRACTION, BILATERAL     IR PARACENTESIS  11/11/2021   OVARIAN CYST SURGERY     REPLACEMENT TOTAL KNEE BILATERAL  08/12/10   TONSILLECTOMY AND ADENOIDECTOMY      SOCIAL HISTORY: Social History   Socioeconomic History   Marital status: Married    Spouse name: Not on file   Number of children: Not on file  Years of education: Not on file   Highest education level: Not on file  Occupational History   Not on file  Tobacco Use   Smoking status: Never   Smokeless tobacco: Never  Vaping Use   Vaping Use: Never used  Substance and Sexual Activity   Alcohol use: No   Drug use: No   Sexual activity: Not on file    Comment: menarche 58, fist preg age 31, hysterectomy '62, no HRT  Other Topics Concern   Not on file  Social History Narrative   Not on file   Social Determinants of Health   Financial Resource Strain: Not on file  Food Insecurity: Not on file  Transportation Needs: Not  on file  Physical Activity: Not on file  Stress: Not on file  Social Connections: Not on file  Intimate Partner Violence: Not on file    FAMILY HISTORY: Family History  Problem Relation Age of Onset   Cancer Brother        esophagus   Cancer Mother        breast   Heart attack Father     ALLERGIES:  is allergic to ace inhibitors.  MEDICATIONS:  Current Outpatient Medications  Medication Sig Dispense Refill   acetaminophen (TYLENOL) 325 MG tablet Take 650 mg by mouth 2 (two) times a week. As needed for pain     albuterol (VENTOLIN HFA) 108 (90 Base) MCG/ACT inhaler Inhale 1-2 puffs into the lungs every 6 (six) hours as needed for wheezing or shortness of breath. (Patient not taking: Reported on 01/01/2021) 8 g 0   benzonatate (TESSALON PERLES) 100 MG capsule Take 1 capsule (100 mg total) by mouth 3 (three) times daily as needed for cough (cough). 20 capsule 0   calcium-vitamin D (OSCAL WITH D) 500-200 MG-UNIT per tablet Take 1 tablet by mouth daily.     clobetasol (TEMOVATE) 0.05 % external solution Apply topically. (Patient not taking: Reported on 10/14/2021)     hydrochlorothiazide (HYDRODIURIL) 25 MG tablet Take 25 mg by mouth 3 (three) times a week.      levothyroxine (SYNTHROID, LEVOTHROID) 50 MCG tablet Take 1 tablet (50 mcg total) by mouth daily. 90 tablet 3   Multiple Vitamin (MULTIVITAMIN WITH MINERALS) TABS Take 1 tablet by mouth daily.     pravastatin (PRAVACHOL) 40 MG tablet Take 40 mg by mouth daily.     tiZANidine (ZANAFLEX) 4 MG tablet Take by mouth.     traMADol (ULTRAM) 50 MG tablet Take 50 mg by mouth as needed for moderate pain.      warfarin (JANTOVEN) 5 MG tablet TAKE ONE TABLET BY MOUTH DAILY 90 tablet 0   No current facility-administered medications for this visit.     PHYSICAL EXAMINATION: ECOG PERFORMANCE STATUS: 2 - Symptomatic, <50% confined to bed  Vitals:   11/20/21 1312  BP: 134/72  Pulse: (!) 56  Resp: 16  Temp: 98.1 F (36.7 C)  SpO2: 100%    Filed Weights   11/20/21 1312  Weight: 118 lb 6.4 oz (53.7 kg)    GENERAL:alert, no distress and comfortable, she was sitting in a wheelchair SKIN: skin color, texture, turgor are normal, no rashes or significant lesions EYES: normal, conjunctiva are pink and non-injected, sclera clear OROPHARYNX:no exudate, no erythema and lips, buccal mucosa, and tongue normal  NECK: supple, thyroid normal size, non-tender, without nodularity LYMPH:  no palpable lymphadenopathy in the cervical, axillary  LUNGS: clear to auscultation and percussion with normal breathing effort HEART:  BLE lower extremity edema 1+ Musculoskeletal:no cyanosis of digits and no clubbing  PSYCH: alert & oriented x 3 with fluent speech NEURO: no focal motor/sensory deficits BREAST: No palpable nodules in breast. No palpable axillary or supraclavicular lymphadenopathy   LABORATORY DATA:  I have reviewed the data as listed Lab Results  Component Value Date   WBC 4.4 10/21/2020   HGB 14.5 10/21/2020   HCT 42.7 10/21/2020   MCV 89.3 10/21/2020   PLT 180 10/21/2020   Lab Results  Component Value Date   NA 141 10/21/2020   K 4.0 10/21/2020   CL 105 10/21/2020   CO2 28 10/21/2020    RADIOGRAPHIC STUDIES: I have personally reviewed the radiological reports and agreed with the findings in the report.  ASSESSMENT AND PLAN:  Breast cancer of upper-outer quadrant of left female breast [facility]) This is an 88 year old female patient with past medical history significant for right breast invasive lobular carcinoma, ER/PR positive, HER2 negative, T1CNX at diagnosis status post lumpectomy followed by adjuvant radiation as well as adjuvant antiestrogen therapy with letrozole for 5 years who recently noticed some change in bowel habits, abdominal pain had CT imaging which showed scattered areas of nodularity and thickening throughout the omentum and mesentery concerning for possible peritoneal carcinomatosis.  She however could not  move forward with biopsy since she is on chronic anticoagulation for atrial fibrillation.  She continues to report some abdominal pain but she is not a good historian, she says pain changes from time to time, she feels a bit better but she still notices that it is very hard to have a bowel movement and she has to take laxative. No palpable masses in the breast, chronically inverted left nipple, no palpable regional adenopathy.  Lungs clear to auscultation.  She is in a wheelchair, abdominal exam limited, overall no significant examination findings.  We have reviewed the imaging results which suggest possible peritoneal carcinomatosis but no definitive evidence of it.  We have discussed about considering biopsy however given chronic anticoagulation with warfarin, this is very challenging.  We have discussed about considering PET/CT to look for other possible primary since that this is not a typical presentation with breast although it can certainly be possible.  Most common causes of peritoneal carcinomatosis are gastrointestinal primaries as well as gynecological primaries.  She could consider evaluation with endoscopy however once again it is challenging to biopsy while on anticoagulation.  Patient appears to be leaning towards comfort care and hospice however son appears to be leaning towards at least investigating and finding a diagnosis before making plan of treatment.  Either way, they are apparently moving to [facility] and he will have to reestablish with primary care, cardiology and medical oncology.  This will unfortunately delay the diagnosis a bit however may not change the overall outcome if she decides not to proceed with any treatment.  I do not believe empirically treating it as a breast cancer is a good idea here.  We will have to have some form of further tissue sampling to make the diagnosis and to discuss appropriate treatment recommendations.  Given her age and ongoing comorbidities as  well as performance status, I do not believe she is a good candidate for aggressive treatment.  Considering palliative care and hospice if this is indeed metastatic carcinoma is not unreasonable.  All their questions were answered to the best of my knowledge.  Thank you for consulting [place] the care of this patient.  Please not hesitate to  contact us with any additional questions or concerns.  This is a new patient to me, last seen by Dr. Jana Hakim in 2017. I spent total of 45 minutes in the care of this patient including history, review of records, counseling and coordination of care  All questions were answered. The patient knows to call the clinic with any problems, questions or concerns.    Benay Pike, MD 11/20/21

## 2021-11-21 ENCOUNTER — Encounter: Payer: Self-pay | Admitting: *Deleted

## 2021-11-21 NOTE — Progress Notes (Unsigned)
Mir, Paula Libra, MD  [facility] Approved for CT guided biopsy of omental soft tissue implants.   Could target left paramedian region on image 63 of series 2   Or   RLQ, just anterior to ascending colon on image 51 or series 2 (will have to be careful with inferior epigastric artery)

## 2021-11-27 DIAGNOSIS — Z5181 Encounter for therapeutic drug level monitoring: Secondary | ICD-10-CM | POA: Diagnosis not present

## 2021-11-27 DIAGNOSIS — I4891 Unspecified atrial fibrillation: Secondary | ICD-10-CM | POA: Diagnosis not present

## 2021-11-27 DIAGNOSIS — Z7901 Long term (current) use of anticoagulants: Secondary | ICD-10-CM | POA: Diagnosis not present

## 2021-11-27 LAB — PROTIME-INR: INR: 2.08 — AB (ref <=1.20)

## 2021-11-28 ENCOUNTER — Ambulatory Visit (INDEPENDENT_AMBULATORY_CARE_PROVIDER_SITE_OTHER): Payer: PPO

## 2021-11-28 DIAGNOSIS — Z5181 Encounter for therapeutic drug level monitoring: Secondary | ICD-10-CM

## 2021-11-28 DIAGNOSIS — I480 Paroxysmal atrial fibrillation: Secondary | ICD-10-CM

## 2021-11-28 NOTE — Patient Instructions (Signed)
Description   Called and spoke with pt's son (on ([place]). Instructed for pt to take 1.5 tablets today and then continue taking Warfarin 1 tablet ('5mg'$ ) daily EXCEPT 1/2 tablet on Monday, Wednesday, and Friday.  Stay consistent with leafy veggies (3 times per week) and Premier Protein drinks (1 everyday)  Recheck INR in 1 week in [place].  [facility] [phone]

## 2021-12-05 ENCOUNTER — Telehealth: Payer: Self-pay | Admitting: *Deleted

## 2021-12-05 NOTE — Telephone Encounter (Signed)
Called pt's son (on [place]) and inquired if pt had INR lab done today and he stated she will be going to her PCP tomorrow and they maybe able to take over and have it done tomorrow. He will call & update [place] if they will. Will await an update regarding this matter.

## 2021-12-28 DEATH — deceased
